# Patient Record
Sex: Male | Born: 1970 | Race: White | Hispanic: No | Marital: Married | Smoking: Never smoker
Health system: Southern US, Community
[De-identification: ages and names within clinical notes are randomized; demographics above are authoritative.]

## PROBLEM LIST (undated history)

## (undated) DIAGNOSIS — J45909 Unspecified asthma, uncomplicated: Secondary | ICD-10-CM

## (undated) DIAGNOSIS — J302 Other seasonal allergic rhinitis: Secondary | ICD-10-CM

## (undated) DIAGNOSIS — Z1211 Encounter for screening for malignant neoplasm of colon: Secondary | ICD-10-CM

## (undated) DIAGNOSIS — G4733 Obstructive sleep apnea (adult) (pediatric): Secondary | ICD-10-CM

## (undated) DIAGNOSIS — C4441 Basal cell carcinoma of skin of scalp and neck: Secondary | ICD-10-CM

## (undated) DIAGNOSIS — F419 Anxiety disorder, unspecified: Secondary | ICD-10-CM

## (undated) DIAGNOSIS — T7840XA Allergy, unspecified, initial encounter: Secondary | ICD-10-CM

## (undated) DIAGNOSIS — G4730 Sleep apnea, unspecified: Secondary | ICD-10-CM

## (undated) DIAGNOSIS — J189 Pneumonia, unspecified organism: Secondary | ICD-10-CM

## (undated) HISTORY — DX: Anxiety disorder, unspecified: F41.9

## (undated) HISTORY — DX: Obstructive sleep apnea (adult) (pediatric): G47.33

## (undated) HISTORY — DX: Basal cell carcinoma of skin of scalp and neck: C44.41

## (undated) HISTORY — DX: Encounter for screening for malignant neoplasm of colon: Z12.11

## (undated) HISTORY — DX: Sleep apnea, unspecified: G47.30

## (undated) HISTORY — DX: Other seasonal allergic rhinitis: J30.2

## (undated) HISTORY — DX: Allergy, unspecified, initial encounter: T78.40XA

---
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
## 2009-10-23 HISTORY — PX: THROAT SURGERY: SHX803

## 2014-05-07 ENCOUNTER — Encounter: Payer: Self-pay | Admitting: Internal Medicine

## 2014-05-07 ENCOUNTER — Ambulatory Visit (INDEPENDENT_AMBULATORY_CARE_PROVIDER_SITE_OTHER): Payer: BC Managed Care – PPO | Admitting: Internal Medicine

## 2014-05-07 VITALS — BP 118/74 | HR 82 | Temp 98.2°F | Ht 73.0 in | Wt 202.0 lb

## 2014-05-07 DIAGNOSIS — J302 Other seasonal allergic rhinitis: Secondary | ICD-10-CM | POA: Insufficient documentation

## 2014-05-07 DIAGNOSIS — K4090 Unilateral inguinal hernia, without obstruction or gangrene, not specified as recurrent: Secondary | ICD-10-CM

## 2014-05-07 DIAGNOSIS — G4733 Obstructive sleep apnea (adult) (pediatric): Secondary | ICD-10-CM | POA: Insufficient documentation

## 2014-05-07 HISTORY — DX: Unilateral inguinal hernia, without obstruction or gangrene, not specified as recurrent: K40.90

## 2014-05-07 NOTE — Progress Notes (Signed)
Pre visit review using our clinic review tool, if applicable. No additional management support is needed unless otherwise documented below in the visit note. 

## 2014-05-07 NOTE — Patient Instructions (Signed)
   Next visit is for a physical exam at your convenience, fasting Please make an appointment       Hernia A hernia occurs when an internal organ pushes out through a weak spot in the abdominal wall. Hernias most commonly occur in the groin and around the navel. Hernias often can be pushed back into place (reduced). Most hernias tend to get worse over time. Some abdominal hernias can get stuck in the opening (irreducible or incarcerated hernia) and cannot be reduced. An irreducible abdominal hernia which is tightly squeezed into the opening is at risk for impaired blood supply (strangulated hernia). A strangulated hernia is a medical emergency. Because of the risk for an irreducible or strangulated hernia, surgery may be recommended to repair a hernia. CAUSES   Heavy lifting.  Prolonged coughing.  Straining to have a bowel movement.  A cut (incision) made during an abdominal surgery. HOME CARE INSTRUCTIONS   Bed rest is not required. You may continue your normal activities.  Avoid lifting more than 10 pounds (4.5 kg) or straining.  Cough gently. If you are a smoker it is best to stop. Even the best hernia repair can break down with the continual strain of coughing. Even if you do not have your hernia repaired, a cough will continue to aggravate the problem.  Do not wear anything tight over your hernia. Do not try to keep it in with an outside bandage or truss. These can damage abdominal contents if they are trapped within the hernia sac.  Eat a normal diet.  Avoid constipation. Straining over long periods of time will increase hernia size and encourage breakdown of repairs. If you cannot do this with diet alone, stool softeners may be used. SEEK IMMEDIATE MEDICAL CARE IF:   You have a fever.  You develop increasing abdominal pain.  You feel nauseous or vomit.  Your hernia is stuck outside the abdomen, looks discolored, feels hard, or is tender.  You have any changes in your  bowel habits or in the hernia that are unusual for you.  You have increased pain or swelling around the hernia.  You cannot push the hernia back in place by applying gentle pressure while lying down. MAKE SURE YOU:   Understand these instructions.  Will watch your condition.  Will get help right away if you are not doing well or get worse. Document Released: 10/09/2005 Document Revised: 01/01/2012 Document Reviewed: 05/28/2008 [facility] Patient Information 2015 [place], [place]. This information is not intended to replace advice given to you by your health care provider. Make sure you discuss any questions you have with your health care provider.

## 2014-05-07 NOTE — Progress Notes (Signed)
   Subjective:    Patient ID: David Lowery, male    DOB: 04-26-1971, 43 y.o.   MRN: [id number]  DOS:  05/07/2014 Type of visit - description: new patient  History: Patient noted a bulge at the L suprapiubic area to 3 weeks ago, since then is not getting worse, denies any pain. Patient feels that this started acutely after he was going upstairs. Denies any scrotal pain or swelling   ROS No fever or chills No nausea, vomiting, diarrhea or blood in the stool  Past Medical History  Diagnosis Date  . Seasonal allergies   . OSA (obstructive sleep apnea)     initial sx: palpitations-fatigue--s/p surgery, much improved   . Anxiety     Past Surgical History  Procedure Laterality Date  . Throat surgery  2011    tonsilectomy, uvulectomy d/t OSA     History   Social History  . Marital Status: Married    Spouse Name: N/A    Number of Children: 1  . Years of Education: N/A   Occupational History  . Engineer, technical sales    Social History Main Topics  . Smoking status: Never Smoker   . Smokeless tobacco: Not on file  . Alcohol Use: Yes     Comment: socially   . Drug Use: Not on file  . Sexual Activity: Not on file   Other Topics Concern  . Not on file   Social History Narrative   Moved to [facility] July 2014, from [employer]        Medication List       This list is accurate as of: 05/07/14  5:25 PM.  Always use your most recent med list.               sertraline 100 MG tablet  Commonly known as:  ZOLOFT  Take 100 mg by mouth daily.           Objective:   Physical Exam  Abdominal:     BP 118/74  Pulse 82  Temp(Src) 98.2 F (36.8 C)  Ht 6\' 1"  (1.854 m)  Wt 202 lb (91.627 kg)  BMI 26.66 kg/m2  SpO2 97%  General -- alert, well-developed, NAD.   HEENT-- Not pale. Lungs -- normal respiratory effort, no intercostal retractions, no accessory muscle use, and normal breath sounds.  Heart-- normal rate, regular rhythm, no murmur.  Abdomen-- Not  distended, good bowel sounds,soft, non-tender.  GU-- normal scrotal contents and penis  Extremities-- no pretibial edema bilaterally  Neurologic--  alert & oriented X3. Speech normal, gait appropriate for age, strength symmetric and appropriate for age.  Psych-- Cognition and judgment appear intact. Cooperative with normal attention span and concentration. No anxious or depressed appearing.      Assessment & Plan:

## 2014-05-07 NOTE — Assessment & Plan Note (Signed)
Findings consistent with an inguinal hernia. Refer to surgery, avoid heavy lifting for now, signs of incarceration discussed, will need  ER visit if that ever  happens

## 2014-05-08 ENCOUNTER — Telehealth: Payer: Self-pay | Admitting: *Deleted

## 2014-05-08 NOTE — Telephone Encounter (Signed)
Pt brought in Medical Clearance form from [employer] sheet attached forms filled out as much as possible, and placed in folder for Dr. Larose Kells to complete and sign.//AB/CMA

## 2014-05-11 ENCOUNTER — Ambulatory Visit (INDEPENDENT_AMBULATORY_CARE_PROVIDER_SITE_OTHER): Payer: BC Managed Care – PPO | Admitting: Internal Medicine

## 2014-05-11 ENCOUNTER — Encounter: Payer: Self-pay | Admitting: Internal Medicine

## 2014-05-11 VITALS — BP 126/78 | HR 71 | Temp 98.2°F | Wt 198.0 lb

## 2014-05-11 DIAGNOSIS — K4090 Unilateral inguinal hernia, without obstruction or gangrene, not specified as recurrent: Secondary | ICD-10-CM

## 2014-05-11 NOTE — Progress Notes (Signed)
Pre visit review using our clinic review tool, if applicable. No additional management support is needed unless otherwise documented below in the visit note. 

## 2014-05-11 NOTE — Assessment & Plan Note (Addendum)
Patient was gived a work note for  light-duty ref hernia, he needs a note saying that he can go back to full duty. The patient thinks he is fully capable  of do that consequently I did a new note; has an appointment pending w/ surgery

## 2014-05-11 NOTE — Progress Notes (Signed)
   Subjective:    Patient ID: David Lowery, male    DOB: 03/26/1971, 43 y.o.   MRN: [id number]  DOS:  05/11/2014 Type of visit - description: Followup from previous visit History: Clinical situation has not changed but he needs a new note for work, see assessment and plan    Past Medical History  Diagnosis Date  . Seasonal allergies   . OSA (obstructive sleep apnea)     initial sx: palpitations-fatigue--s/p surgery, much improved   . Anxiety     Past Surgical History  Procedure Laterality Date  . Throat surgery  2011    tonsilectomy, uvulectomy d/t OSA     History   Social History  . Marital Status: Married    Spouse Name: N/A    Number of Children: 1  . Years of Education: N/A   Occupational History  . Engineer, technical sales    Social History Main Topics  . Smoking status: Never Smoker   . Smokeless tobacco: Not on file  . Alcohol Use: Yes     Comment: socially   . Drug Use: Not on file  . Sexual Activity: Not on file   Other Topics Concern  . Not on file   Social History Narrative   Moved to [facility] July 2014, from [employer]        Medication List       This list is accurate as of: 05/11/14  5:43 PM.  Always use your most recent med list.               sertraline 100 MG tablet  Commonly known as:  ZOLOFT  Take 100 mg by mouth daily.           Objective:   Physical Exam BP 126/78  Pulse 71  Temp(Src) 98.2 F (36.8 C)  Wt 198 lb (89.812 kg)  SpO2 98%  General -- alert, well-developed, NAD.   Psych-- Cognition and judgment appear intact. Cooperative with normal attention span and concentration. No anxious or depressed appearing.       Assessment & Plan:

## 2014-05-18 NOTE — Telephone Encounter (Signed)
Dr. Larose Kells reviewed and signed.  Pt came to clinic and picked up forms.  Forms labeled and placed in basket to be scanned.

## 2014-05-28 ENCOUNTER — Ambulatory Visit (INDEPENDENT_AMBULATORY_CARE_PROVIDER_SITE_OTHER): Payer: BC Managed Care – PPO | Admitting: General Surgery

## 2014-06-09 ENCOUNTER — Ambulatory Visit (INDEPENDENT_AMBULATORY_CARE_PROVIDER_SITE_OTHER): Payer: BC Managed Care – PPO | Admitting: General Surgery

## 2014-06-09 ENCOUNTER — Encounter (INDEPENDENT_AMBULATORY_CARE_PROVIDER_SITE_OTHER): Payer: Self-pay | Admitting: General Surgery

## 2014-06-09 VITALS — BP 118/76 | HR 71 | Temp 97.1°F | Resp 16 | Ht 72.0 in | Wt 196.4 lb

## 2014-06-09 DIAGNOSIS — K4090 Unilateral inguinal hernia, without obstruction or gangrene, not specified as recurrent: Secondary | ICD-10-CM

## 2014-06-09 NOTE — Progress Notes (Signed)
Patient ID: David Lowery, male   DOB: 1970-10-25, 43 y.o.   MRN: [id number]  No chief complaint on file.   HPI David Lowery is a 42 y.o. male.   The patient is a 43 year old male who is referred for evaluation of a left inguinal hernia. The patient states she is unaware of when this began. He does not have any pain however notices a bulge. The patient works as an Electrical engineer. He states it is not interfering with his daily functions and activities.   HPI  Past Medical History  Diagnosis Date  . Seasonal allergies   . OSA (obstructive sleep apnea)     initial sx: palpitations-fatigue--s/p surgery, much improved   . Anxiety     Past Surgical History  Procedure Laterality Date  . Throat surgery  2011    tonsilectomy, uvulectomy d/t OSA     Family History  Problem Relation Age of Onset  . Hyperlipidemia Brother     bro, mother, GM  . CAD Neg Hx   . Stroke Neg Hx   . Colon cancer Other     GM  . Prostate cancer Neg Hx   . Diabetes Other     aunt    Social History History  Substance Use Topics  . Smoking status: Never Smoker   . Smokeless tobacco: Not on file  . Alcohol Use: Yes     Comment: socially     No Known Allergies  Current Outpatient Prescriptions  Medication Sig Dispense Refill  . sertraline (ZOLOFT) 100 MG tablet Take 100 mg by mouth daily.       No current facility-administered medications for this visit.    Review of Systems Review of Systems  Constitutional: Negative.   HENT: Negative.   Eyes: Negative.   Respiratory: Negative.   Cardiovascular: Negative.   Gastrointestinal: Negative.   Endocrine: Negative.   Neurological: Negative.     Blood pressure 118/76, pulse 71, temperature 97.1 F (36.2 C), temperature source Temporal, resp. rate 16, height 6' (1.829 m), weight 196 lb 6.4 oz (89.086 kg).  Physical Exam Physical Exam  Constitutional: He is oriented to person, place, and time. He appears well-developed and well-nourished.   HENT:  Head: Normocephalic and atraumatic.  Eyes: Conjunctivae and EOM are normal. Pupils are equal, round, and reactive to light.  Neck: Normal range of motion. Neck supple.  Cardiovascular: Normal rate, regular rhythm and normal heart sounds.   Pulmonary/Chest: Effort normal and breath sounds normal.  Abdominal: A hernia is present. Hernia confirmed positive in the left inguinal area. Hernia confirmed negative in the right inguinal area.  Musculoskeletal: Normal range of motion.  Neurological: He is alert and oriented to person, place, and time.  Skin: Skin is warm and dry.    Data Reviewed none  Assessment    43 year old male with a left likely direct inguinal hernia     Plan    1. Patient likely be observed at least for the next one to 2 months. We did discuss laparoscopic hernia repair. He would be an option in this most likely in the next few months. 2. Discussed with him the signs or symptoms of incarceration and strangulation, and the need to proceed to the ER should these occur. 3. The patient recalls that he decide about surgery.        Rosario Jacks., David Lowery 06/09/2014, 9:09 AM

## 2014-08-12 ENCOUNTER — Ambulatory Visit (INDEPENDENT_AMBULATORY_CARE_PROVIDER_SITE_OTHER): Payer: BC Managed Care – PPO | Admitting: Internal Medicine

## 2014-08-12 ENCOUNTER — Encounter: Payer: Self-pay | Admitting: Internal Medicine

## 2014-08-12 VITALS — BP 142/78 | HR 101 | Temp 97.5°F | Ht 73.0 in | Wt 196.1 lb

## 2014-08-12 DIAGNOSIS — Z0000 Encounter for general adult medical examination without abnormal findings: Secondary | ICD-10-CM

## 2014-08-12 DIAGNOSIS — K4090 Unilateral inguinal hernia, without obstruction or gangrene, not specified as recurrent: Secondary | ICD-10-CM

## 2014-08-12 DIAGNOSIS — L989 Disorder of the skin and subcutaneous tissue, unspecified: Secondary | ICD-10-CM

## 2014-08-12 DIAGNOSIS — R7989 Other specified abnormal findings of blood chemistry: Secondary | ICD-10-CM

## 2014-08-12 HISTORY — DX: Encounter for general adult medical examination without abnormal findings: Z00.00

## 2014-08-12 LAB — CBC WITH DIFFERENTIAL/PLATELET
BASOS PCT: 0.5 % (ref 0.0–3.0)
Basophils Absolute: 0 10*3/uL (ref 0.0–0.1)
EOS PCT: 3.6 % (ref 0.0–5.0)
Eosinophils Absolute: 0.2 10*3/uL (ref 0.0–0.7)
HCT: 44.6 % (ref 39.0–52.0)
HEMOGLOBIN: 15.1 g/dL (ref 13.0–17.0)
LYMPHS PCT: 30.9 % (ref 12.0–46.0)
Lymphs Abs: 1.9 10*3/uL (ref 0.7–4.0)
MCHC: 33.9 g/dL (ref 30.0–36.0)
MCV: 94 fl (ref 78.0–100.0)
Monocytes Absolute: 0.3 10*3/uL (ref 0.1–1.0)
Monocytes Relative: 5.2 % (ref 3.0–12.0)
Neutro Abs: 3.7 10*3/uL (ref 1.4–7.7)
Neutrophils Relative %: 59.8 % (ref 43.0–77.0)
Platelets: 231 10*3/uL (ref 150.0–400.0)
RBC: 4.74 Mil/uL (ref 4.22–5.81)
RDW: 12.5 % (ref 11.5–15.5)
WBC: 6.2 10*3/uL (ref 4.0–10.5)

## 2014-08-12 LAB — TSH: TSH: 0.27 u[IU]/mL — ABNORMAL LOW (ref 0.35–4.50)

## 2014-08-12 NOTE — Progress Notes (Signed)
Subjective:    Patient ID: David Lowery, male    DOB: [birth year], 43 y.o.   MRN: [id number]  DOS:  08/12/2014 Type of visit - description : CPX Interval history: In general, feels well 2 year history of a cyst in the back, likes it to be checked. Also has a spot at the nose, it bleeds on and off, never heals completely   ROS Diet-- regular but trying to avoid fatty-fast food  Exercise-- very active at work  No  CP, SOB No palpitations Denies  nausea, vomiting diarrhea, blood in the stools  (-) cough, sputum production (-) wheezing, chest congestion No dysuria, gross hematuria, difficulty urinating    No anxiety, depression (on meds , well controlled)      Past Medical History  Diagnosis Date  . Seasonal allergies   . OSA (obstructive sleep apnea)     initial sx: palpitations-fatigue--s/p surgery, much improved   . Anxiety     Past Surgical History  Procedure Laterality Date  . Throat surgery  2011    tonsilectomy, uvulectomy d/t OSA     History   Social History  . Marital Status: Married    Spouse Name: N/A    Number of Children: 1  . Years of Education: N/A   Occupational History  . Engineer, technical sales    Social History Main Topics  . Smoking status: Never Smoker   . Smokeless tobacco: Not on file  . Alcohol Use: Yes     Comment: socially   . Drug Use: Not on file  . Sexual Activity: Not on file   Other Topics Concern  . Not on file   Social History Narrative   Moved to [facility] July 2014, from [employer]     Family History  Problem Relation Age of Onset  . Hyperlipidemia Brother     bro, mother, GM  . CAD Neg Hx   . Stroke Neg Hx   . Colon cancer Other     GM at age 7  . Prostate cancer Neg Hx   . Diabetes Other     aunt       Medication List       This list is accurate as of: 08/12/14 10:27 AM.  Always use your most recent med list.               sertraline 100 MG tablet  Commonly known as:  ZOLOFT  Take 100 mg  by mouth daily.           Objective:   Physical Exam BP 142/78  Pulse 101  Temp(Src) 97.5 F (36.4 C) (Oral)  Ht 6\' 1"  (1.854 m)  Wt 196 lb 2 oz (88.962 kg)  BMI 25.88 kg/m2  SpO2 97%  General -- alert, well-developed, NAD.  Neck --no thyromegaly , normal carotid pulse  HEENT-- L side of the nose has a 2-3 mm area of redness-scallines  At the right trapezoid area has a 1.5 cm subcutaneous mass consistent with a cyst, not attached to deeper structures Lungs -- normal respiratory effort, no intercostal retractions, no accessory muscle use, and normal breath sounds.  Heart-- normal rate, regular rhythm, no murmur.  Abdomen-- Not distended, good bowel sounds,soft, non-tender.  Extremities-- no pretibial edema bilaterally  Neurologic--  alert & oriented X3. Speech normal, gait appropriate for age, strength symmetric and appropriate for age.  Psych-- Cognition and judgment appear intact. Cooperative with normal attention span and concentration. No anxious or  depressed appearing.     Assessment & Plan:

## 2014-08-12 NOTE — Assessment & Plan Note (Addendum)
Td 2007 Declined flu shot  cscope-- never Labs ekg-- nsr, no acute Other: Skin lesion, BCC? -- to derm Sebaceous cyst-- observation Diet-exercise-- discussed  STE

## 2014-08-12 NOTE — Patient Instructions (Signed)
Get your blood work before you leave    Check the  blood pressure monthly  Be sure your blood pressure is between  145/85 and 110/65.  if it is consistently higher or lower, let me know   If you need more information about a healthy diet,   visit  the American Heart Association, it  is a great resource online at:  http://www.richard-flynn.net/    Please come back to the office in 1 year for a physical exam. Come back fasting     Testicular Self-Exam A self-examination of your testicles involves looking at and feeling your testicles for abnormal lumps or swelling. Several things can cause swelling, lumps, or pain in your testicles. Some of these causes are:  Injuries.  Inflammation.  Infection.  Accumulation of fluids around your testicle (hydrocele).  Twisted testicles (testicular torsion).  Testicular cancer. Self-examination of the testicles and groin areas may be advised if you are at risk for testicular cancer. Risks for testicular cancer include:  An undescended testicle (cryptorchidism).  A history of previous testicular cancer.  A family history of testicular cancer. The testicles are easiest to examine after warm baths or showers and are more difficult to examine when you are cold. This is because the muscles attached to the testicles retract and pull them up higher or into the abdomen. Follow these steps while you are standing:  Hold your penis away from your body.  Roll one testicle between your thumb and forefinger, feeling the entire testicle.  Roll the other testicle between your thumb and forefinger, feeling the entire testicle. Feel for lumps, swelling, or discomfort. A normal testicle is egg shaped and feels firm. It is smooth and not tender. The spermatic cord can be felt as a firm spaghetti-like cord at the back of your testicle. It is also important to examine the crease between the front of your leg and your abdomen. Feel for any bumps that are  tender. These could be enlarged lymph nodes.  Document Released: 01/15/2001 Document Revised: 06/11/2013 Document Reviewed: 03/31/2013 [facility] Patient Information 2015 [place], [place]. This information is not intended to replace advice given to you by your health care provider. Make sure you discuss any questions you have with your health care provider.

## 2014-08-12 NOTE — Progress Notes (Signed)
Pre visit review using our clinic review tool, if applicable. No additional management support is needed unless otherwise documented below in the visit note. 

## 2014-08-12 NOTE — Assessment & Plan Note (Signed)
Plans for surgery 10-2014

## 2014-08-13 LAB — LIPID PANEL
CHOLESTEROL: 185 mg/dL (ref 0–200)
HDL: 39.3 mg/dL (ref >39.00)
LDL CALC: 122 mg/dL — AB (ref 0–99)
NonHDL: 145.7
TRIGLYCERIDES: 121 mg/dL (ref 0.0–149.0)
Total CHOL/HDL Ratio: 5
VLDL: 24.2 mg/dL (ref 0.0–40.0)

## 2014-08-13 LAB — COMPREHENSIVE METABOLIC PANEL
ALBUMIN: 4 g/dL (ref 3.5–5.2)
ALK PHOS: 66 U/L (ref 39–117)
ALT: 31 U/L (ref 0–53)
AST: 33 U/L (ref 0–37)
BUN: 14 mg/dL (ref 6–23)
CALCIUM: 9.3 mg/dL (ref 8.4–10.5)
CHLORIDE: 103 meq/L (ref 96–112)
CO2: 30 mEq/L (ref 19–32)
CREATININE: 0.8 mg/dL (ref 0.4–1.5)
GFR: 108.3 mL/min (ref >60.00)
GLUCOSE: 88 mg/dL (ref 70–99)
POTASSIUM: 4 meq/L (ref 3.5–5.1)
Sodium: 138 mEq/L (ref 135–145)
Total Bilirubin: 1.3 mg/dL — ABNORMAL HIGH (ref 0.2–1.2)
Total Protein: 7.5 g/dL (ref 6.0–8.3)

## 2014-08-17 NOTE — Addendum Note (Signed)
Addended by: Wilfrid Lund on: 08/17/2014 11:17 AM   Modules accepted: Orders

## 2014-09-22 ENCOUNTER — Other Ambulatory Visit: Payer: Self-pay | Admitting: Internal Medicine

## 2014-09-28 ENCOUNTER — Other Ambulatory Visit: Payer: Self-pay | Admitting: Internal Medicine

## 2014-11-16 ENCOUNTER — Other Ambulatory Visit (INDEPENDENT_AMBULATORY_CARE_PROVIDER_SITE_OTHER): Payer: BLUE CROSS/BLUE SHIELD

## 2014-11-16 DIAGNOSIS — R7989 Other specified abnormal findings of blood chemistry: Secondary | ICD-10-CM

## 2014-11-16 LAB — TSH: TSH: 0.95 u[IU]/mL (ref 0.35–4.50)

## 2014-11-16 LAB — T4, FREE: FREE T4: 0.77 ng/dL (ref 0.60–1.60)

## 2014-11-16 LAB — T3, FREE: T3 FREE: 3.8 pg/mL (ref 2.3–4.2)

## 2015-10-29 ENCOUNTER — Telehealth: Payer: Self-pay | Admitting: Internal Medicine

## 2015-10-29 MED ORDER — SERTRALINE HCL 100 MG PO TABS: 100.0000 mg | ORAL_TABLET | Freq: Every day | ORAL | Status: DC

## 2015-10-29 NOTE — Telephone Encounter (Signed)
Pharmacy: [facility] [address]  Reason for call: pt called for refill on sertraline 100mg . Has 5 left. Scheduled cpe for 11/30/14. Pt would like 30 day supply sent in until he comes for appt.

## 2015-10-29 NOTE — Telephone Encounter (Signed)
Please advise, Pt has not been seen since 07/2014.

## 2015-10-29 NOTE — Telephone Encounter (Signed)
Ok  #30 

## 2015-10-29 NOTE — Telephone Encounter (Signed)
Rx sent 

## 2015-11-29 ENCOUNTER — Other Ambulatory Visit: Payer: Self-pay | Admitting: Internal Medicine

## 2015-11-30 ENCOUNTER — Telehealth: Payer: Self-pay | Admitting: *Deleted

## 2015-11-30 NOTE — Telephone Encounter (Signed)
Unable to reach patient at time of pre-visit call. Left message for patient to return call when available.  

## 2015-12-01 ENCOUNTER — Encounter: Payer: Self-pay | Admitting: Internal Medicine

## 2015-12-01 ENCOUNTER — Ambulatory Visit (INDEPENDENT_AMBULATORY_CARE_PROVIDER_SITE_OTHER): Payer: BLUE CROSS/BLUE SHIELD | Admitting: Internal Medicine

## 2015-12-01 VITALS — BP 112/58 | HR 65 | Temp 98.1°F | Ht 73.0 in | Wt 205.2 lb

## 2015-12-01 DIAGNOSIS — Z0000 Encounter for general adult medical examination without abnormal findings: Secondary | ICD-10-CM | POA: Diagnosis not present

## 2015-12-01 DIAGNOSIS — Z23 Encounter for immunization: Secondary | ICD-10-CM

## 2015-12-01 DIAGNOSIS — Z114 Encounter for screening for human immunodeficiency virus [HIV]: Secondary | ICD-10-CM

## 2015-12-01 MED ORDER — SERTRALINE HCL 100 MG PO TABS: 100.0000 mg | ORAL_TABLET | Freq: Every day | ORAL | Status: DC

## 2015-12-01 NOTE — Assessment & Plan Note (Signed)
Td 2007, today Declined flu shot  cscope-- never Labs: CMP, CBC, TSH, FLP, HIV. Discussed s diet and exercise

## 2015-12-01 NOTE — Patient Instructions (Signed)
  GO TO THE FRONT DESK  Schedule labs to be done within few days (fasting)   Schedule a complete physical exam to be done in 1 year Please be fasting

## 2015-12-01 NOTE — Progress Notes (Signed)
Pre visit review using our clinic review tool, if applicable. No additional management support is needed unless otherwise documented below in the visit note. 

## 2015-12-01 NOTE — Progress Notes (Signed)
Subjective:    Patient ID: David Lowery, male    DOB: 1971/07/10, 45 y.o.   MRN: [id number]  DOS:  12/01/2015 Type of visit - description : cpx Interval history: In general feeling great, active at work, does watch his diet.    Review of Systems Constitutional: No fever. No chills. No unexplained wt changes. No unusual sweats  HEENT: No dental problems, no ear discharge, no facial swelling, no voice changes. No eye discharge, no eye  redness , no  intolerance to light   Respiratory: No wheezing , no  difficulty breathing. No cough , no mucus production  Cardiovascular: No CP, no leg swelling , no  Palpitations  GI: no nausea, no vomiting, no diarrhea , no  abdominal pain.  No blood in the stools. No dysphagia, no odynophagia    Endocrine: No polyphagia, no polyuria , no polydipsia  GU: No dysuria, gross hematuria, difficulty urinating. No urinary urgency, no frequency.  Musculoskeletal: No joint swellings or unusual aches or pains  Skin: No change in the color of the skin, palor , no  Rash  Allergic, immunologic: No environmental allergies , no  food allergies  Neurological: No dizziness no  syncope. No headaches. No diplopia, no slurred, no slurred speech, no motor deficits, no facial  Numbness  Hematological: No enlarged lymph nodes, no easy bruising , no unusual bleedings  Psychiatry: No suicidal ideas, no hallucinations, no beavior problems, no confusion.  No unusual/severe anxiety, no depression    Past Medical History  Diagnosis Date  . Seasonal allergies   . OSA (obstructive sleep apnea)     initial sx: palpitations-fatigue--s/p surgery, much improved   . Anxiety     Past Surgical History  Procedure Laterality Date  . Throat surgery  2011    tonsilectomy, uvulectomy d/t OSA     Social History   Social History  . Marital Status: Married    Spouse Name: N/A  . Number of Children: 1  . Years of Education: N/A   Occupational History  . Control and instrumentation engineer     [employer]   Social History Main Topics  . Smoking status: Never Smoker   . Smokeless tobacco: Never Used  . Alcohol Use: Yes     Comment: socially   . Drug Use: Not on file  . Sexual Activity: Not on file   Other Topics Concern  . Not on file   Social History Narrative   Moved to [facility] July 2014, from [place] [employer]   Son born ~ 2012     Family History  Problem Relation Age of Onset  . Hyperlipidemia Brother     bro, mother, GM  . CAD Neg Hx   . Stroke Neg Hx   . Colon cancer Other     GM at age 24  . Prostate cancer Neg Hx   . Diabetes Other     aunt       Medication List       This list is accurate as of: 12/01/15  5:19 PM.  Always use your most recent med list.               sertraline 100 MG tablet  Commonly known as:  ZOLOFT  Take 1 tablet (100 mg total) by mouth daily.           Objective:   Physical Exam BP 112/58 mmHg  Pulse 65  Temp(Src) 98.1 F (36.7 C) (Oral)  Ht 6'  1" (1.854 m)  Wt 205 lb 4 oz (93.101 kg)  BMI 27.09 kg/m2  SpO2 96% General:   Well developed, well nourished . NAD.  HEENT:  Normocephalic . Face symmetric, atraumatic. Neck: No thyromegaly Lungs:  CTA B Normal respiratory effort, no intercostal retractions, no accessory muscle use. Heart: RRR,  no murmur.  no pretibial edema bilaterally  Abdomen:  Not distended, soft, non-tender. No rebound or rigidity.   Skin: Not pale. Not jaundice Neurologic:  alert & oriented X3.  Speech normal, gait appropriate for age and unassisted Psych--  Cognition and judgment appear intact.  Cooperative with normal attention span and concentration.  Behavior appropriate. No anxious or depressed appearing.     Assessment & Plan:   Assessment OSA -initial sx: palpitations-fatigue--s/p surgery, much improved  Anxiety Allergies Hernia L ----- saw surgery 2015, offered surgery Actinic keratosis, nevus -- saw derm, excisions 2015   Plan Anxiety well controlled,  refill medications. Hernia: Decided not to proceed with surgery, asymptomatic. RTC 1 year

## 2015-12-03 ENCOUNTER — Other Ambulatory Visit (INDEPENDENT_AMBULATORY_CARE_PROVIDER_SITE_OTHER): Payer: BLUE CROSS/BLUE SHIELD

## 2015-12-03 DIAGNOSIS — Z0000 Encounter for general adult medical examination without abnormal findings: Secondary | ICD-10-CM | POA: Diagnosis not present

## 2015-12-03 DIAGNOSIS — Z114 Encounter for screening for human immunodeficiency virus [HIV]: Secondary | ICD-10-CM

## 2015-12-03 LAB — COMPREHENSIVE METABOLIC PANEL
ALT: 30 U/L (ref 0–53)
AST: 22 U/L (ref 0–37)
Albumin: 4.4 g/dL (ref 3.5–5.2)
Alkaline Phosphatase: 62 U/L (ref 39–117)
BILIRUBIN TOTAL: 0.8 mg/dL (ref 0.2–1.2)
BUN: 14 mg/dL (ref 6–23)
CHLORIDE: 104 meq/L (ref 96–112)
CO2: 29 meq/L (ref 19–32)
CREATININE: 0.76 mg/dL (ref 0.40–1.50)
Calcium: 9.3 mg/dL (ref 8.4–10.5)
GFR: 119.14 mL/min (ref >60.00)
Glucose, Bld: 103 mg/dL — ABNORMAL HIGH (ref 70–99)
Potassium: 4 mEq/L (ref 3.5–5.1)
SODIUM: 139 meq/L (ref 135–145)
Total Protein: 7 g/dL (ref 6.0–8.3)

## 2015-12-03 LAB — TSH: TSH: 1.01 u[IU]/mL (ref 0.35–4.50)

## 2015-12-03 LAB — LIPID PANEL
CHOL/HDL RATIO: 6
Cholesterol: 192 mg/dL (ref 0–200)
HDL: 33.9 mg/dL — ABNORMAL LOW (ref >39.00)
LDL CALC: 129 mg/dL — AB (ref 0–99)
NONHDL: 158.4
Triglycerides: 147 mg/dL (ref 0.0–149.0)
VLDL: 29.4 mg/dL (ref 0.0–40.0)

## 2015-12-04 LAB — HIV ANTIBODY (ROUTINE TESTING W REFLEX): HIV 1&2 Ab, 4th Generation: NONREACTIVE

## 2015-12-05 LAB — CBC WITH DIFFERENTIAL/PLATELET
BASOS ABS: 0 10*3/uL (ref 0.0–0.1)
Basophils Relative: 0.6 % (ref 0.0–3.0)
EOS ABS: 0.2 10*3/uL (ref 0.0–0.7)
Eosinophils Relative: 3.1 % (ref 0.0–5.0)
HEMATOCRIT: 45.9 % (ref 39.0–52.0)
HEMOGLOBIN: 14.8 g/dL (ref 13.0–17.0)
LYMPHS PCT: 26 % (ref 12.0–46.0)
Lymphs Abs: 1.8 10*3/uL (ref 0.7–4.0)
MCHC: 32.2 g/dL (ref 30.0–36.0)
MCV: 100.4 fl — ABNORMAL HIGH (ref 78.0–100.0)
Monocytes Absolute: 0.4 10*3/uL (ref 0.1–1.0)
Monocytes Relative: 6.5 % (ref 3.0–12.0)
Neutro Abs: 4.3 10*3/uL (ref 1.4–7.7)
Neutrophils Relative %: 63.8 % (ref 43.0–77.0)
PLATELETS: 267 10*3/uL (ref 150.0–400.0)
RBC: 4.57 Mil/uL (ref 4.22–5.81)
RDW: 13.1 % (ref 11.5–15.5)
WBC: 6.7 10*3/uL (ref 4.0–10.5)

## 2016-03-27 ENCOUNTER — Encounter: Payer: Self-pay | Admitting: Internal Medicine

## 2016-03-27 ENCOUNTER — Ambulatory Visit (INDEPENDENT_AMBULATORY_CARE_PROVIDER_SITE_OTHER): Payer: BLUE CROSS/BLUE SHIELD | Admitting: Internal Medicine

## 2016-03-27 VITALS — BP 132/72 | HR 72 | Temp 98.1°F | Ht 73.0 in | Wt 211.4 lb

## 2016-03-27 DIAGNOSIS — J189 Pneumonia, unspecified organism: Secondary | ICD-10-CM

## 2016-03-27 NOTE — Progress Notes (Signed)
Subjective:    Patient ID: David Lowery, male    DOB: 1971/10/07, 45 y.o.   MRN: [id number]  DOS:  03/27/2016 Type of visit - description : Pneumonia follow-up Interval history: Patient reports in April had the flu, high fever, a number of symptoms, since then he was left with a lingering cough that got worse 2 weeks ago.  was seen 03/19/2016 at a UC d/t cough  apparently no chest x-ray was done but he was diagnosed with pneumonia and prescribed Augmentin, albuterol, hydrocodone based on clinical findings. He took Augmentin albuterol and Mucinex and is feeling much better.   Review of Systems  At this point denies fever chills Cough has diminished No wheezing No nausea, vomiting, diarrhea Past Medical History  Diagnosis Date  . Seasonal allergies   . OSA (obstructive sleep apnea)     initial sx: palpitations-fatigue--s/p surgery, much improved   . Anxiety     Past Surgical History  Procedure Laterality Date  . Throat surgery  2011    tonsilectomy, uvulectomy d/t OSA     Social History   Social History  . Marital Status: Married    Spouse Name: N/A  . Number of Children: 1  . Years of Education: N/A   Occupational History  . Engineer, technical sales     [employer]   Social History Main Topics  . Smoking status: Never Smoker   . Smokeless tobacco: Never Used  . Alcohol Use: Yes     Comment: socially   . Drug Use: Not on file  . Sexual Activity: Not on file   Other Topics Concern  . Not on file   Social History Narrative   Moved to [facility] July 2014, from [place] [employer]   Son born ~ 2012        Medication List       This list is accurate as of: 03/27/16 11:59 PM.  Always use your most recent med list.               amoxicillin-clavulanate 875-125 MG tablet  Commonly known as:  AUGMENTIN  Take 1 tablet by mouth 2 (two) times daily.     PROAIR HFA 108 (90 Base) MCG/ACT inhaler  Generic drug:  albuterol  Inhale 1-2 puffs into the lungs every 6 (six)  hours as needed.     sertraline 100 MG tablet  Commonly known as:  ZOLOFT  Take 1 tablet (100 mg total) by mouth daily.     XYZAL ALLERGY 24HR PO  Take 1 tablet by mouth daily.           Objective:   Physical Exam BP 132/72 mmHg  Pulse 72  Temp(Src) 98.1 F (36.7 C) (Oral)  Ht 6\' 1"  (1.854 m)  Wt 211 lb 6 oz (95.879 kg)  BMI 27.89 kg/m2  SpO2 96% General:   Well developed, well nourished . NAD.  HEENT:  Normocephalic . Face symmetric, atraumatic. TMs normal, throat without redness, nose not congested Lungs:  CTA B Normal respiratory effort, no intercostal retractions, no accessory muscle use. Heart: RRR,  no murmur.  No pretibial edema bilaterally  Skin: Not pale. Not jaundice Neurologic:  alert & oriented X3.  Speech normal, gait appropriate for age and unassisted Psych--  Cognition and judgment appear intact.  Cooperative with normal attention span and concentration.  Behavior appropriate. No anxious or depressed appearing.      Assessment & Plan:   Assessment OSA -initial sx: palpitations-fatigue--s/p surgery,  much improved  Anxiety Allergies Hernia L ----- saw surgery 2015, offered surgery Actinic keratosis, nevus -- saw derm, excisions 2015   Plan Pneumonia: Diagnosed on clinical grounds,responded very well to Augmentin, exam today is essentially normal. Plan: Finish Augmentin, continue with albuterol and Mucinex when necessary, call if not completely well or if symptoms resurface; patient in agreement RTC 11-2016 as a schedule for CPX

## 2016-03-27 NOTE — Progress Notes (Signed)
Pre visit review using our clinic review tool, if applicable. No additional management support is needed unless otherwise documented below in the visit note. 

## 2016-03-28 DIAGNOSIS — Z09 Encounter for follow-up examination after completed treatment for conditions other than malignant neoplasm: Secondary | ICD-10-CM

## 2016-03-28 HISTORY — DX: Encounter for follow-up examination after completed treatment for conditions other than malignant neoplasm: Z09

## 2016-03-28 NOTE — Assessment & Plan Note (Signed)
Pneumonia: Diagnosed on clinical grounds,responded very well to Augmentin, exam today is essentially normal. Plan: Finish Augmentin, continue with albuterol and Mucinex when necessary, call if not completely well or if symptoms resurface; patient in agreement RTC 11-2016 as a schedule for CPX

## 2016-06-08 ENCOUNTER — Other Ambulatory Visit: Payer: Self-pay

## 2016-06-08 MED ORDER — SERTRALINE HCL 100 MG PO TABS: 100.0000 mg | ORAL_TABLET | Freq: Every day | ORAL | 2 refills | Status: DC

## 2016-12-02 ENCOUNTER — Emergency Department (HOSPITAL_BASED_OUTPATIENT_CLINIC_OR_DEPARTMENT_OTHER)
Admission: EM | Admit: 2016-12-02 | Discharge: 2016-12-03 | Disposition: A | Discharge status: Home / Self Care | Payer: BLUE CROSS/BLUE SHIELD | Attending: Emergency Medicine | Admitting: Emergency Medicine

## 2016-12-02 ENCOUNTER — Encounter (HOSPITAL_BASED_OUTPATIENT_CLINIC_OR_DEPARTMENT_OTHER): Payer: Self-pay | Admitting: *Deleted

## 2016-12-02 DIAGNOSIS — R1031 Right lower quadrant pain: Secondary | ICD-10-CM | POA: Diagnosis not present

## 2016-12-02 DIAGNOSIS — J45909 Unspecified asthma, uncomplicated: Secondary | ICD-10-CM | POA: Diagnosis not present

## 2016-12-02 DIAGNOSIS — R109 Unspecified abdominal pain: Secondary | ICD-10-CM | POA: Diagnosis not present

## 2016-12-02 LAB — URINALYSIS, ROUTINE W REFLEX MICROSCOPIC
BILIRUBIN URINE: NEGATIVE
GLUCOSE, UA: NEGATIVE mg/dL
HGB URINE DIPSTICK: NEGATIVE
KETONES UR: NEGATIVE mg/dL
LEUKOCYTES UA: NEGATIVE
Nitrite: NEGATIVE
PH: 6.5 (ref 5.0–8.0)
PROTEIN: NEGATIVE mg/dL
Specific Gravity, Urine: 1.022 (ref 1.005–1.030)

## 2016-12-02 NOTE — ED Provider Notes (Signed)
[place] [facility] Provider Note: David Spurling, MD, FACEP  CSN: [id number] MRN: [id number] ARRIVAL: 12/02/16 at 2106 ROOM: MH04/MH04  By signing my name below, I, David Lowery, attest that this documentation has been prepared under the direction and in the presence of David Rosser, MD. Electronically Signed: Soijett Lowery, ED Scribe. 12/02/16. 12:12 AM.  CHIEF COMPLAINT  Abdominal Pain   HISTORY OF PRESENT ILLNESS  David Lowery is a 46 y.o. male who presents to the Emergency Department complaining of episodic right sided abdominal pain onset 3 weeks ago worsening last night. Pt notes that his pain began radiating to his right lower quadrant 2 days ago. The patient is here after he experienced sudden onset RLQ abdominal pain yesterday evening. His pain episodes have typically been lasting about 10 minutes but the one that brought him to the ED lasted about 40 minutes. He is not aware of any triggering factors such as eating. Pt reports that he had similar symptoms 7 years ago in [place] with a negative workup. He hasn't tried any medications for the relief of his symptoms. He denies nausea, vomiting, diarrhea, constipation, fever, chills and urinary symptoms.     Past Medical History:  Diagnosis Date  . Anxiety   . OSA (obstructive sleep apnea)    initial sx: palpitations-fatigue--s/p surgery, much improved   . Seasonal allergies     Past Surgical History:  Procedure Laterality Date  . THROAT SURGERY  2011   tonsilectomy, uvulectomy d/t OSA     Family History  Problem Relation Age of Onset  . Hyperlipidemia Brother     bro, mother, GM  . CAD Neg Hx   . Stroke Neg Hx   . Colon cancer Other     GM at age 89  . Prostate cancer Neg Hx   . Diabetes Other     aunt    Social History  Substance Use Topics  . Smoking status: Never Smoker  . Smokeless tobacco: Never Used  . Alcohol use Yes     Comment: socially     Prior to Admission medications   Medication Sig  Start Date End Date Taking? Authorizing Provider  sertraline (ZOLOFT) 100 MG tablet Take 1 tablet (100 mg total) by mouth daily. 06/08/16   Colon Branch, MD    Allergies Sudafed [pseudoephedrine]   REVIEW OF SYSTEMS  Negative except as noted here or in the History of Present Illness.   PHYSICAL EXAMINATION  Initial Vital Signs Blood pressure 146/85, pulse 79, temperature 98.1 F (36.7 C), temperature source Oral, resp. rate 20, height 6' (1.829 m), weight 205 lb (93 kg), SpO2 93 %.  Examination General: Well-developed, well-nourished male in no acute distress; appearance consistent with age of record HENT: normocephalic; atraumatic Eyes: pupils equal, round and reactive to light; extraocular muscles intact Neck: supple Heart: regular rate and rhythm; no murmurs, rubs or gallops Lungs: clear to auscultation bilaterally Abdomen: soft; nondistended; mild LLQ tenderness which does not reproduce the pain of the chief complaint; no masses or hepatosplenomegaly; bowel sounds present; apparent cholelithiasis on bedside ultrasound:   Extremities: No deformity; full range of motion; pulses normal Neurologic: Awake, alert and oriented; motor function intact in all extremities and symmetric; no facial droop Skin: Warm and dry Psychiatric: Normal mood and affect   RESULTS  Summary of this visit's results, reviewed by myself:   EKG Interpretation  Date/Time:    Ventricular Rate:    PR Interval:    QRS Duration:  QT Interval:    QTC Calculation:   R Axis:     Text Interpretation:        Laboratory Studies: Results for orders placed or performed during the hospital encounter of 12/02/16 (from the past 24 hour(s))  Urinalysis, Routine w reflex microscopic     Status: None   Collection Time: 12/02/16  9:47 PM  Result Value Ref Range   Color, Urine YELLOW YELLOW   APPearance CLEAR CLEAR   Specific Gravity, Urine 1.022 1.005 - 1.030   pH 6.5 5.0 - 8.0   Glucose, UA NEGATIVE  NEGATIVE mg/dL   Hgb urine dipstick NEGATIVE NEGATIVE   Bilirubin Urine NEGATIVE NEGATIVE   Ketones, ur NEGATIVE NEGATIVE mg/dL   Protein, ur NEGATIVE NEGATIVE mg/dL   Nitrite NEGATIVE NEGATIVE   Leukocytes, UA NEGATIVE NEGATIVE  CBC with Differential/Platelet     Status: Abnormal   Collection Time: 12/03/16 12:00 AM  Result Value Ref Range   WBC 10.7 (H) 4.0 - 10.5 K/uL   RBC 4.44 4.22 - 5.81 MIL/uL   Hemoglobin 14.4 13.0 - 17.0 g/dL   HCT 40.1 39.0 - 52.0 %   MCV 90.3 78.0 - 100.0 fL   MCH 32.4 26.0 - 34.0 pg   MCHC 35.9 30.0 - 36.0 g/dL   RDW 11.7 11.5 - 15.5 %   Platelets 232 150 - 400 K/uL   Neutrophils Relative % 78 %   Neutro Abs 8.5 (H) 1.7 - 7.7 K/uL   Lymphocytes Relative 14 %   Lymphs Abs 1.5 0.7 - 4.0 K/uL   Monocytes Relative 7 %   Monocytes Absolute 0.7 0.1 - 1.0 K/uL   Eosinophils Relative 1 %   Eosinophils Absolute 0.1 0.0 - 0.7 K/uL   Basophils Relative 0 %   Basophils Absolute 0.0 0.0 - 0.1 K/uL  Comprehensive metabolic panel     Status: Abnormal   Collection Time: 12/03/16 12:00 AM  Result Value Ref Range   Sodium 138 135 - 145 mmol/L   Potassium 3.6 3.5 - 5.1 mmol/L   Chloride 103 101 - 111 mmol/L   CO2 29 22 - 32 mmol/L   Glucose, Bld 131 (H) 65 - 99 mg/dL   BUN 12 6 - 20 mg/dL   Creatinine, Ser 0.72 0.61 - 1.24 mg/dL   Calcium 9.2 8.9 - 10.3 mg/dL   Total Protein 7.0 6.5 - 8.1 g/dL   Albumin 4.3 3.5 - 5.0 g/dL   AST 238 (H) 15 - 41 U/L   ALT 157 (H) 17 - 63 U/L   Alkaline Phosphatase 80 38 - 126 U/L   Total Bilirubin 1.5 (H) 0.3 - 1.2 mg/dL   GFR calc non Af Amer >60 >60 mL/min   GFR calc Af Amer >60 >60 mL/min   Anion gap 6 5 - 15  Lipase, blood     Status: Abnormal   Collection Time: 12/03/16 12:00 AM  Result Value Ref Range   Lipase 174 (H) 11 - 51 U/L   Imaging Studies: Ct Abdomen Pelvis W Contrast  Result Date: 12/03/2016 CLINICAL DATA:  Sudden onset right upper quadrant abdominal pain tonight. Intermittent pain for 2 weeks. Abnormal  liver function studies and lipase. EXAM: CT ABDOMEN AND PELVIS WITH CONTRAST TECHNIQUE: Multidetector CT imaging of the abdomen and pelvis was performed using the standard protocol following bolus administration of intravenous contrast. CONTRAST:  142mL ISOVUE-300 IOPAMIDOL (ISOVUE-300) INJECTION 61% COMPARISON:  None. FINDINGS: Lower chest: Mild dependent changes in the lung bases. Hepatobiliary: No  focal liver abnormality is seen. No gallstones, gallbladder wall thickening, or biliary dilatation. Pancreas: Unremarkable. No pancreatic ductal dilatation or surrounding inflammatory changes. Spleen: Normal in size without focal abnormality. Adrenals/Urinary Tract: No adrenal gland nodules. Horseshoe kidneys. Renal nephrograms are symmetrical. No hydronephrosis or hydroureter. Bladder wall is not thickened and no bladder filling defects are demonstrated. Stomach/Bowel: Stomach, small bowel, and colon are not abnormally distended. Stool fills the colon. No wall thickening or inflammatory changes. Appendix is normal. Vascular/Lymphatic: No significant vascular findings are present. No enlarged abdominal or pelvic lymph nodes. Reproductive: Prostate is unremarkable. Other: Small left inguinal hernia containing fat. Abdominal wall musculature is otherwise intact. No free air or free fluid in the abdomen. Musculoskeletal: No acute or significant osseous findings. IMPRESSION: No acute process demonstrated in the abdomen or pelvis. No evidence of bowel obstruction or inflammation. 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 Electronically Signed   By: Lucienne Capers M.D.   On: 12/03/2016 04:06    ED COURSE  Nursing notes and initial vitals signs, including pulse oximetry, reviewed.  Vitals:   12/03/16 0215 12/03/16 0230 12/03/16 0300 12/03/16 0330  BP: 133/76 122/82 130/76 134/82  Pulse: 65 68 69 63  Resp: 16     Temp:      TempSrc:      SpO2: 98% 96% 96% 96%  Weight:      Height:       1:57  AM Patient continues to be pain-free. In light of his elevated lipase and LFTs we will get CT of the abdomen and pelvis.  4:34 AM Patient remains pain-free. CT scan without acute process. Suspect elevated LFTs and lipase are related to biliary colic. We will have him return for ultrasound later today.  PROCEDURES    ED DIAGNOSES     ICD-9-CM ICD-10-CM   1. Right lower quadrant abdominal pain 789.03 R10.31     I personally performed the services described in this documentation, which was scribed in my presence. The recorded information has been reviewed and is accurate.     David Rosser, MD 12/03/16 [phone]

## 2016-12-02 NOTE — ED Triage Notes (Signed)
Sudden onset abdominal pain RUQ tonight.  States that he has had intermittent pain x 2 weeks.  States hx of same 7 years ago-was worked up and all tests were negative.  States that pain has now subsided.  Denies N/V/D.

## 2016-12-03 ENCOUNTER — Ambulatory Visit (HOSPITAL_BASED_OUTPATIENT_CLINIC_OR_DEPARTMENT_OTHER)
Admit: 2016-12-03 | Discharge: 2016-12-03 | Disposition: A | Discharge status: Home / Self Care | Payer: BLUE CROSS/BLUE SHIELD | Attending: Emergency Medicine | Admitting: Emergency Medicine

## 2016-12-03 ENCOUNTER — Emergency Department (HOSPITAL_BASED_OUTPATIENT_CLINIC_OR_DEPARTMENT_OTHER): Payer: BLUE CROSS/BLUE SHIELD

## 2016-12-03 DIAGNOSIS — R1031 Right lower quadrant pain: Secondary | ICD-10-CM | POA: Diagnosis not present

## 2016-12-03 DIAGNOSIS — R109 Unspecified abdominal pain: Secondary | ICD-10-CM | POA: Diagnosis not present

## 2016-12-03 DIAGNOSIS — R1011 Right upper quadrant pain: Secondary | ICD-10-CM | POA: Diagnosis not present

## 2016-12-03 LAB — CBC WITH DIFFERENTIAL/PLATELET
BASOS ABS: 0 10*3/uL (ref 0.0–0.1)
BASOS PCT: 0 %
EOS ABS: 0.1 10*3/uL (ref 0.0–0.7)
Eosinophils Relative: 1 %
HEMATOCRIT: 40.1 % (ref 39.0–52.0)
Hemoglobin: 14.4 g/dL (ref 13.0–17.0)
Lymphocytes Relative: 14 %
Lymphs Abs: 1.5 10*3/uL (ref 0.7–4.0)
MCH: 32.4 pg (ref 26.0–34.0)
MCHC: 35.9 g/dL (ref 30.0–36.0)
MCV: 90.3 fL (ref 78.0–100.0)
Monocytes Absolute: 0.7 10*3/uL (ref 0.1–1.0)
Monocytes Relative: 7 %
NEUTROS ABS: 8.5 10*3/uL — AB (ref 1.7–7.7)
NEUTROS PCT: 78 %
Platelets: 232 10*3/uL (ref 150–400)
RBC: 4.44 MIL/uL (ref 4.22–5.81)
RDW: 11.7 % (ref 11.5–15.5)
WBC: 10.7 10*3/uL — AB (ref 4.0–10.5)

## 2016-12-03 LAB — COMPREHENSIVE METABOLIC PANEL
ALBUMIN: 4.3 g/dL (ref 3.5–5.0)
ALK PHOS: 80 U/L (ref 38–126)
ALT: 157 U/L — AB (ref 17–63)
ANION GAP: 6 (ref 5–15)
AST: 238 U/L — AB (ref 15–41)
BILIRUBIN TOTAL: 1.5 mg/dL — AB (ref 0.3–1.2)
BUN: 12 mg/dL (ref 6–20)
CALCIUM: 9.2 mg/dL (ref 8.9–10.3)
CO2: 29 mmol/L (ref 22–32)
CREATININE: 0.72 mg/dL (ref 0.61–1.24)
Chloride: 103 mmol/L (ref 101–111)
GFR calc Af Amer: >60 mL/min (ref >60)
GFR calc non Af Amer: >60 mL/min (ref >60)
GLUCOSE: 131 mg/dL — AB (ref 65–99)
Potassium: 3.6 mmol/L (ref 3.5–5.1)
Sodium: 138 mmol/L (ref 135–145)
Total Protein: 7 g/dL (ref 6.5–8.1)

## 2016-12-03 LAB — LIPASE, BLOOD: Lipase: 174 U/L — ABNORMAL HIGH (ref 11–51)

## 2016-12-03 MED ORDER — IOPAMIDOL (ISOVUE-300) INJECTION 61%
100.0000 mL | Freq: Once | INTRAVENOUS | Status: AC | PRN
  Administered 2016-12-03: 100 mL via INTRAVENOUS

## 2016-12-03 NOTE — ED Notes (Signed)
PT discharged to home NAD.  

## 2016-12-03 NOTE — ED Provider Notes (Signed)
D/w patient findings of RUQ [place].  Pt is pain free at this time and asymptomatic.  D/w Dr. Marlou Starks with surgery - recommends follow up in office but prompt return if recurrent symptoms.     Quintella Reichert, MD 12/04/16 [phone]

## 2016-12-03 NOTE — ED Notes (Signed)
Pt alert, NAD, calm, interactive, resps e/u, no dyspnea noted, (denies pain, sob, nausea or dizziness). Pending CT.

## 2016-12-04 ENCOUNTER — Encounter: Payer: Self-pay | Admitting: Internal Medicine

## 2016-12-04 ENCOUNTER — Ambulatory Visit (INDEPENDENT_AMBULATORY_CARE_PROVIDER_SITE_OTHER): Payer: 59 | Admitting: Internal Medicine

## 2016-12-04 VITALS — BP 122/78 | HR 77 | Temp 98.7°F | Resp 14 | Ht 73.0 in | Wt 204.0 lb

## 2016-12-04 DIAGNOSIS — K8020 Calculus of gallbladder without cholecystitis without obstruction: Secondary | ICD-10-CM | POA: Diagnosis not present

## 2016-12-04 LAB — COMPREHENSIVE METABOLIC PANEL
ALBUMIN: 4.6 g/dL (ref 3.5–5.2)
ALK PHOS: 86 U/L (ref 39–117)
ALT: 115 U/L — AB (ref 0–53)
AST: 50 U/L — AB (ref 0–37)
BILIRUBIN TOTAL: 0.7 mg/dL (ref 0.2–1.2)
BUN: 12 mg/dL (ref 6–23)
CALCIUM: 9.4 mg/dL (ref 8.4–10.5)
CO2: 28 mEq/L (ref 19–32)
CREATININE: 0.77 mg/dL (ref 0.40–1.50)
Chloride: 102 mEq/L (ref 96–112)
GFR: 116.8 mL/min (ref >60.00)
Glucose, Bld: 102 mg/dL — ABNORMAL HIGH (ref 70–99)
Potassium: 3.8 mEq/L (ref 3.5–5.1)
SODIUM: 138 meq/L (ref 135–145)
TOTAL PROTEIN: 7.5 g/dL (ref 6.0–8.3)

## 2016-12-04 LAB — CBC WITH DIFFERENTIAL/PLATELET
BASOS ABS: 0.1 10*3/uL (ref 0.0–0.1)
Basophils Relative: 0.6 % (ref 0.0–3.0)
EOS ABS: 0.2 10*3/uL (ref 0.0–0.7)
Eosinophils Relative: 1.8 % (ref 0.0–5.0)
HEMATOCRIT: 43.5 % (ref 39.0–52.0)
HEMOGLOBIN: 15 g/dL (ref 13.0–17.0)
LYMPHS PCT: 12.4 % (ref 12.0–46.0)
Lymphs Abs: 1.7 10*3/uL (ref 0.7–4.0)
MCHC: 34.6 g/dL (ref 30.0–36.0)
MCV: 92.6 fl (ref 78.0–100.0)
MONO ABS: 0.8 10*3/uL (ref 0.1–1.0)
Monocytes Relative: 6.1 % (ref 3.0–12.0)
Neutro Abs: 10.9 10*3/uL — ABNORMAL HIGH (ref 1.4–7.7)
Neutrophils Relative %: 79.1 % — ABNORMAL HIGH (ref 43.0–77.0)
Platelets: 232 10*3/uL (ref 150.0–400.0)
RBC: 4.69 Mil/uL (ref 4.22–5.81)
RDW: 12.4 % (ref 11.5–15.5)
WBC: 13.8 10*3/uL — AB (ref 4.0–10.5)

## 2016-12-04 LAB — LIPASE: LIPASE: 109 U/L — AB (ref 11.0–59.0)

## 2016-12-04 NOTE — Assessment & Plan Note (Signed)
Cholelithiasis: sx of cholelithiasis for 3 or 4 weeks, on and off, worse episode was 2 days ago, currently is essentially asymptomatic. At the ER, LFTs were elevated. He has an appointment to see surgery 12/13/2016. Plan: Labs, keep surgery appointment, ER if symptoms resurface, recommend bland diet with small portions.

## 2016-12-04 NOTE — Patient Instructions (Signed)
Get your labs  Keep your appointment to see the surgeon 12/13/2016  ER if severe pain, fever, chills

## 2016-12-04 NOTE — Progress Notes (Signed)
Pre visit review using our clinic review tool, if applicable. No additional management support is needed unless otherwise documented below in the visit note. 

## 2016-12-04 NOTE — Progress Notes (Signed)
Subjective:    Patient ID: David Lowery, male    DOB: Feb 21, 1971, 45 y.o.   MRN: [id number]  DOS:  12/04/2016 Type of visit - description :  Interval history: Went to the ER With episodic right-sided abdominal pain for 3 weeks. Chart reviewed At the ER, LFTs were elevated, ultrasound show a gallbladder stone, CT of the abdomen show no additional abnormalities.   Review of Systems Since he left the hospital, denies fever, chills. Has minimal right-sided abdominal discomfort. No nausea, vomiting, diarrhea. No dysuria or gross hematuria.  Past Medical History:  Diagnosis Date  . Anxiety   . OSA (obstructive sleep apnea)    initial sx: palpitations-fatigue--s/p surgery, much improved   . Seasonal allergies     Past Surgical History:  Procedure Laterality Date  . THROAT SURGERY  2011   tonsilectomy, uvulectomy d/t OSA     Social History   Social History  . Marital status: Married    Spouse name: N/A  . Number of children: 1  . Years of education: N/A   Occupational History  . Astronomer    [employer]   Social History Main Topics  . Smoking status: Never Smoker  . Smokeless tobacco: Never Used  . Alcohol use Yes     Comment: socially   . Drug use: Unknown  . Sexual activity: Not on file   Other Topics Concern  . Not on file   Social History Narrative   Moved to [facility] July 2014, from [employer]   Son born ~ 2012      Allergies as of 12/04/2016      Reactions   Sudafed [pseudoephedrine] Anxiety, Palpitations      Medication List       Accurate as of 12/04/16  9:05 PM. Always use your most recent med list.          fexofenadine 30 MG tablet Commonly known as:  ALLEGRA Take 30 mg by mouth daily as needed.   sertraline 100 MG tablet Commonly known as:  ZOLOFT Take 1 tablet (100 mg total) by mouth daily.          Objective:   Physical Exam BP 122/78 (BP Location: Left Arm, Patient Position: Sitting, Cuff  Size: Normal)   Pulse 77   Temp 98.7 F (37.1 C) (Oral)   Resp 14   Ht 6\' 1"  (1.854 m)   Wt 204 lb (92.5 kg)   SpO2 98%   BMI 26.91 kg/m  General:   Well developed, well nourished . NAD.  HEENT:  Normocephalic . Face symmetric, atraumatic Lungs:  CTA B Normal respiratory effort, no intercostal retractions, no accessory muscle use. Heart: RRR,  no murmur.  no pretibial edema bilaterally  Abdomen:  Not distended, soft, non-tender. No rebound or rigidity.  Skin: Not pale. Not jaundice Neurologic:  alert & oriented X3.  Speech normal, gait appropriate for age and unassisted Psych--  Cognition and judgment appear intact.  Cooperative with normal attention span and concentration.  Behavior appropriate. No anxious or depressed appearing.    Assessment & Plan:   Assessment OSA -initial sx: palpitations-fatigue--s/p surgery, much improved  Anxiety Allergies Hernia L ----- saw surgery 2015, offered surgery Actinic keratosis, nevus -- saw derm, excisions 2015   Plan Cholelithiasis: sx of cholelithiasis for 3 or 4 weeks, on and off, worse episode was 2 days ago, currently is essentially asymptomatic. At the ER, LFTs were elevated. He has an appointment  to see surgery 12/13/2016. Plan: Labs, keep surgery appointment, ER if symptoms resurface, recommend bland diet with small portions.

## 2016-12-05 ENCOUNTER — Ambulatory Visit: Payer: Self-pay | Admitting: General Surgery

## 2016-12-05 ENCOUNTER — Encounter (HOSPITAL_COMMUNITY): Payer: Self-pay | Admitting: *Deleted

## 2016-12-06 ENCOUNTER — Ambulatory Visit (HOSPITAL_COMMUNITY)
Admission: RE | Admit: 2016-12-06 | Discharge: 2016-12-06 | Disposition: A | Discharge status: Home / Self Care | Payer: 59 | Source: Ambulatory Visit | Attending: General Surgery | Admitting: General Surgery

## 2016-12-06 ENCOUNTER — Encounter (HOSPITAL_COMMUNITY)
Admission: RE | Disposition: A | Discharge status: Home / Self Care | Payer: Self-pay | Source: Ambulatory Visit | Attending: General Surgery

## 2016-12-06 ENCOUNTER — Encounter (HOSPITAL_COMMUNITY): Payer: Self-pay | Admitting: Urology

## 2016-12-06 ENCOUNTER — Ambulatory Visit (HOSPITAL_COMMUNITY): Payer: 59 | Admitting: Certified Registered Nurse Anesthetist

## 2016-12-06 ENCOUNTER — Ambulatory Visit (HOSPITAL_COMMUNITY): Payer: 59

## 2016-12-06 DIAGNOSIS — K851 Biliary acute pancreatitis without necrosis or infection: Secondary | ICD-10-CM

## 2016-12-06 DIAGNOSIS — K8010 Calculus of gallbladder with chronic cholecystitis without obstruction: Secondary | ICD-10-CM | POA: Insufficient documentation

## 2016-12-06 DIAGNOSIS — G4730 Sleep apnea, unspecified: Secondary | ICD-10-CM | POA: Diagnosis not present

## 2016-12-06 DIAGNOSIS — K859 Acute pancreatitis without necrosis or infection, unspecified: Secondary | ICD-10-CM | POA: Insufficient documentation

## 2016-12-06 DIAGNOSIS — R109 Unspecified abdominal pain: Secondary | ICD-10-CM | POA: Diagnosis present

## 2016-12-06 HISTORY — DX: Unspecified asthma, uncomplicated: J45.909

## 2016-12-06 HISTORY — PX: CHOLECYSTECTOMY: SHX55

## 2016-12-06 SURGERY — LAPAROSCOPIC CHOLECYSTECTOMY WITH INTRAOPERATIVE CHOLANGIOGRAM
Anesthesia: General | Site: Abdomen

## 2016-12-06 MED ORDER — ACETAMINOPHEN 500 MG PO TABS
ORAL_TABLET | ORAL | Status: AC
  Administered 2016-12-06: 1000 mg via ORAL
  Filled 2016-12-06: qty 2

## 2016-12-06 MED ORDER — HYDROMORPHONE HCL 1 MG/ML IJ SOLN: 0.2500 mg | INTRAMUSCULAR | Status: DC | PRN

## 2016-12-06 MED ORDER — SUGAMMADEX SODIUM 200 MG/2ML IV SOLN
INTRAVENOUS | Status: DC | PRN
  Administered 2016-12-06: 200 mg via INTRAVENOUS

## 2016-12-06 MED ORDER — CHLORHEXIDINE GLUCONATE CLOTH 2 % EX PADS: 6.0000 | MEDICATED_PAD | Freq: Once | CUTANEOUS | Status: DC

## 2016-12-06 MED ORDER — EPHEDRINE SULFATE 50 MG/ML IJ SOLN
INTRAMUSCULAR | Status: DC | PRN
  Administered 2016-12-06: 10 mg via INTRAVENOUS

## 2016-12-06 MED ORDER — SUCCINYLCHOLINE CHLORIDE 200 MG/10ML IV SOSY
PREFILLED_SYRINGE | INTRAVENOUS | Status: DC | PRN
  Administered 2016-12-06: 20 mg via INTRAVENOUS

## 2016-12-06 MED ORDER — CEFAZOLIN SODIUM-DEXTROSE 2-4 GM/100ML-% IV SOLN
2.0000 g | INTRAVENOUS | Status: AC
  Administered 2016-12-06: 2 g via INTRAVENOUS

## 2016-12-06 MED ORDER — MIDAZOLAM HCL 2 MG/2ML IJ SOLN
INTRAMUSCULAR | Status: AC
  Filled 2016-12-06: qty 2

## 2016-12-06 MED ORDER — BUPIVACAINE HCL 0.25 % IJ SOLN
INTRAMUSCULAR | Status: DC | PRN
  Administered 2016-12-06: 16 mL

## 2016-12-06 MED ORDER — SODIUM CHLORIDE 0.9 % IR SOLN
Status: DC | PRN
  Administered 2016-12-06: 1000 mL

## 2016-12-06 MED ORDER — LIDOCAINE HCL (CARDIAC) 20 MG/ML IV SOLN
INTRAVENOUS | Status: DC | PRN
  Administered 2016-12-06: 60 mg via INTRATRACHEAL
  Administered 2016-12-06: 100 mg via INTRAVENOUS

## 2016-12-06 MED ORDER — SODIUM CHLORIDE 0.9 % IV SOLN
INTRAVENOUS | Status: DC | PRN
  Administered 2016-12-06: 5 mL

## 2016-12-06 MED ORDER — KETOROLAC TROMETHAMINE 30 MG/ML IJ SOLN: 30.0000 mg | Freq: Once | INTRAMUSCULAR | Status: AC

## 2016-12-06 MED ORDER — BUPIVACAINE HCL (PF) 0.25 % IJ SOLN
INTRAMUSCULAR | Status: AC
  Filled 2016-12-06: qty 30

## 2016-12-06 MED ORDER — DIPHENHYDRAMINE HCL 50 MG/ML IJ SOLN
INTRAMUSCULAR | Status: DC | PRN
  Administered 2016-12-06: 12.5 mg via INTRAVENOUS

## 2016-12-06 MED ORDER — CELECOXIB 200 MG PO CAPS
400.0000 mg | ORAL_CAPSULE | ORAL | Status: AC
  Administered 2016-12-06: 400 mg via ORAL

## 2016-12-06 MED ORDER — PROMETHAZINE HCL 25 MG/ML IJ SOLN: 6.2500 mg | INTRAMUSCULAR | Status: DC | PRN

## 2016-12-06 MED ORDER — MIDAZOLAM HCL 5 MG/5ML IJ SOLN
INTRAMUSCULAR | Status: DC | PRN
  Administered 2016-12-06: 2 mg via INTRAVENOUS

## 2016-12-06 MED ORDER — GABAPENTIN 300 MG PO CAPS
300.0000 mg | ORAL_CAPSULE | ORAL | Status: AC
  Administered 2016-12-06: 300 mg via ORAL

## 2016-12-06 MED ORDER — DEXAMETHASONE SODIUM PHOSPHATE 10 MG/ML IJ SOLN
INTRAMUSCULAR | Status: DC | PRN
  Administered 2016-12-06: 5 mg via INTRAVENOUS

## 2016-12-06 MED ORDER — LACTATED RINGERS IV SOLN
INTRAVENOUS | Status: DC
  Administered 2016-12-06 (×2): via INTRAVENOUS

## 2016-12-06 MED ORDER — 0.9 % SODIUM CHLORIDE (POUR BTL) OPTIME
TOPICAL | Status: DC | PRN
Start: 2016-12-06 — End: 2016-12-06
  Administered 2016-12-06: 1000 mL

## 2016-12-06 MED ORDER — GABAPENTIN 300 MG PO CAPS
ORAL_CAPSULE | ORAL | Status: AC
  Administered 2016-12-06: 300 mg via ORAL
  Filled 2016-12-06: qty 1

## 2016-12-06 MED ORDER — ONDANSETRON HCL 4 MG/2ML IJ SOLN
INTRAMUSCULAR | Status: DC | PRN
  Administered 2016-12-06: 4 mg via INTRAVENOUS

## 2016-12-06 MED ORDER — HYDROCODONE-ACETAMINOPHEN 5-325 MG PO TABS
ORAL_TABLET | ORAL | Status: AC
  Filled 2016-12-06: qty 1

## 2016-12-06 MED ORDER — FENTANYL CITRATE (PF) 100 MCG/2ML IJ SOLN
INTRAMUSCULAR | Status: AC
  Filled 2016-12-06: qty 2

## 2016-12-06 MED ORDER — CELECOXIB 200 MG PO CAPS
ORAL_CAPSULE | ORAL | Status: AC
  Administered 2016-12-06: 400 mg via ORAL
  Filled 2016-12-06: qty 2

## 2016-12-06 MED ORDER — FENTANYL CITRATE (PF) 100 MCG/2ML IJ SOLN
INTRAMUSCULAR | Status: DC | PRN
  Administered 2016-12-06 (×2): 50 ug via INTRAVENOUS

## 2016-12-06 MED ORDER — MEPERIDINE HCL 25 MG/ML IJ SOLN: 6.2500 mg | INTRAMUSCULAR | Status: DC | PRN

## 2016-12-06 MED ORDER — CEFAZOLIN SODIUM-DEXTROSE 2-4 GM/100ML-% IV SOLN
INTRAVENOUS | Status: AC
  Filled 2016-12-06: qty 100

## 2016-12-06 MED ORDER — ACETAMINOPHEN 500 MG PO TABS
1000.0000 mg | ORAL_TABLET | ORAL | Status: AC
  Administered 2016-12-06: 1000 mg via ORAL

## 2016-12-06 MED ORDER — ROCURONIUM BROMIDE 100 MG/10ML IV SOLN
INTRAVENOUS | Status: DC | PRN
  Administered 2016-12-06: 40 mg via INTRAVENOUS

## 2016-12-06 MED ORDER — HYDROCODONE-ACETAMINOPHEN 5-325 MG PO TABS: 1.0000 | ORAL_TABLET | ORAL | 0 refills | Status: DC | PRN

## 2016-12-06 MED ORDER — PROPOFOL 10 MG/ML IV BOLUS
INTRAVENOUS | Status: DC | PRN
  Administered 2016-12-06: 200 mg via INTRAVENOUS

## 2016-12-06 MED ORDER — IOPAMIDOL (ISOVUE-300) INJECTION 61%
INTRAVENOUS | Status: AC
  Filled 2016-12-06: qty 50

## 2016-12-06 MED ORDER — HYDROCODONE-ACETAMINOPHEN 5-325 MG PO TABS
1.0000 | ORAL_TABLET | Freq: Once | ORAL | Status: AC
  Administered 2016-12-06: 1 via ORAL

## 2016-12-06 MED ORDER — GLYCOPYRROLATE 0.2 MG/ML IJ SOLN
INTRAMUSCULAR | Status: DC | PRN
  Administered 2016-12-06: 0.2 mg via INTRAVENOUS
  Administered 2016-12-06 (×2): 0.1 mg via INTRAVENOUS

## 2016-12-06 SURGICAL SUPPLY — 39 items
APPLIER CLIP 5 13 M/L LIGAMAX5 (MISCELLANEOUS) ×2
BLADE SURG ROTATE 9660 (MISCELLANEOUS) ×2 IMPLANT
CANISTER SUCTION 2500CC (MISCELLANEOUS) ×2 IMPLANT
CATH REDDICK CHOLANGI 4FR 50CM (CATHETERS) ×2 IMPLANT
CHLORAPREP W/TINT 26ML (MISCELLANEOUS) ×2 IMPLANT
CLIP APPLIE 5 13 M/L LIGAMAX5 (MISCELLANEOUS) ×1 IMPLANT
COVER MAYO STAND STRL (DRAPES) ×2 IMPLANT
COVER SURGICAL LIGHT HANDLE (MISCELLANEOUS) ×2 IMPLANT
DERMABOND ADVANCED (GAUZE/BANDAGES/DRESSINGS) ×1
DERMABOND ADVANCED .7 DNX12 (GAUZE/BANDAGES/DRESSINGS) ×1 IMPLANT
DRAPE C-ARM 42X72 X-RAY (DRAPES) ×2 IMPLANT
ELECT REM PT RETURN 9FT ADLT (ELECTROSURGICAL) ×2
ELECTRODE REM PT RTRN 9FT ADLT (ELECTROSURGICAL) ×1 IMPLANT
GLOVE BIO SURGEON STRL SZ7.5 (GLOVE) ×4 IMPLANT
GLOVE BIOGEL PI IND STRL 6.5 (GLOVE) ×2 IMPLANT
GLOVE BIOGEL PI IND STRL 7.5 (GLOVE) ×1 IMPLANT
GLOVE BIOGEL PI INDICATOR 6.5 (GLOVE) ×2
GLOVE BIOGEL PI INDICATOR 7.5 (GLOVE) ×1
GLOVE ECLIPSE 6.0 STRL STRAW (GLOVE) ×2 IMPLANT
GLOVE SURG SS PI 6.0 STRL IVOR (GLOVE) ×2 IMPLANT
GOWN STRL REUS W/ TWL LRG LVL3 (GOWN DISPOSABLE) ×4 IMPLANT
GOWN STRL REUS W/TWL LRG LVL3 (GOWN DISPOSABLE) ×4
IV CATH 14GX2 1/4 (CATHETERS) ×2 IMPLANT
KIT BASIN OR (CUSTOM PROCEDURE TRAY) ×2 IMPLANT
KIT ROOM TURNOVER OR (KITS) ×2 IMPLANT
NS IRRIG 1000ML POUR BTL (IV SOLUTION) ×2 IMPLANT
PAD ARMBOARD 7.5X6 YLW CONV (MISCELLANEOUS) ×2 IMPLANT
POUCH SPECIMEN RETRIEVAL 10MM (ENDOMECHANICALS) ×2 IMPLANT
SCISSORS LAP 5X35 DISP (ENDOMECHANICALS) ×2 IMPLANT
SET IRRIG TUBING LAPAROSCOPIC (IRRIGATION / IRRIGATOR) ×2 IMPLANT
SLEEVE ENDOPATH XCEL 5M (ENDOMECHANICALS) ×4 IMPLANT
SPECIMEN JAR SMALL (MISCELLANEOUS) ×2 IMPLANT
SUT MNCRL AB 4-0 PS2 18 (SUTURE) ×4 IMPLANT
TOWEL OR 17X24 6PK STRL BLUE (TOWEL DISPOSABLE) ×2 IMPLANT
TOWEL OR 17X26 10 PK STRL BLUE (TOWEL DISPOSABLE) ×2 IMPLANT
TRAY LAPAROSCOPIC MC (CUSTOM PROCEDURE TRAY) ×2 IMPLANT
TROCAR XCEL BLUNT TIP 100MML (ENDOMECHANICALS) ×2 IMPLANT
TROCAR XCEL NON-BLD 5MMX100MML (ENDOMECHANICALS) ×2 IMPLANT
TUBING INSUFFLATION (TUBING) ×2 IMPLANT

## 2016-12-06 NOTE — Op Note (Signed)
12/06/2016  3:54 PM  PATIENT:  David Lowery  46 y.o. male  PRE-OPERATIVE DIAGNOSIS:  GALLSTONE PANCREATITIS  POST-OPERATIVE DIAGNOSIS:  GALLSTONE PANCREATITIS  PROCEDURE:  Procedure(s): LAPAROSCOPIC CHOLECYSTECTOMY WITH INTRAOPERATIVE CHOLANGIOGRAM (N/A)  SURGEON:  Surgeon(s) and Role:    * Jovita Kussmaul, MD - Primary  PHYSICIAN ASSISTANT:   ASSISTANTS: Sharyn Dross, RNFA   ANESTHESIA:   local and general  EBL:  Total I/O In: 1000 [I.V.:1000] Out: 30 [Blood:30]  BLOOD ADMINISTERED:none  DRAINS: none   LOCAL MEDICATIONS USED:  MARCAINE     SPECIMEN:  Source of Specimen:  gallbladder  DISPOSITION OF SPECIMEN:  PATHOLOGY  COUNTS:  YES  TOURNIQUET:  * No tourniquets in log *  DICTATION: .Dragon Dictation   Procedure: After informed consent was obtained the patient was brought to the operating room and placed in the supine position on the operating room table. After adequate induction of general anesthesia the patient's abdomen was prepped with ChloraPrep allowed to dry and draped in usual sterile manner. The area below the umbilicus was infiltrated with quarter percent  Marcaine. A small incision was made with a 15 blade knife. The incision was carried down through the subcutaneous tissue bluntly with a hemostat and Army-Nav[employer] retractors. The linea alba was identified. The linea alba was incised with a 15 blade knife and each side was grasped with Coker clamps. The preperitoneal space was then probed with a hemostat until the peritoneum was opened and access was gained to the abdominal cavity. A 0 Vicryl pursestring stitch was placed in the fascia surrounding the opening. A Hassan cannula was then placed through the opening and anchored in place with the previously placed Vicryl purse string stitch. The abdomen was insufflated with carbon dioxide without difficulty. A laparoscope was inserted through the [facility] cannula in the right upper quadrant was inspected. Next the  epigastric region was infiltrated with % Marcaine. A small incision was made with a 15 blade knife. A 5 mm port was placed bluntly through this incision into the abdominal cavity under direct vision. Next 2 sites were chosen laterally on the right side of the abdomen for placement of 5 mm ports. Each of these areas was infiltrated with quarter percent Marcaine. Small stab incisions were made with a 15 blade knife. 5 mm ports were then placed bluntly through these incisions into the abdominal cavity under direct vision without difficulty. A blunt grasper was placed through the lateralmost 5 mm port and used to grasp the dome of the gallbladder and elevated anteriorly and superiorly. Another blunt grasper was placed through the other 5 mm port and used to retract the body and neck of the gallbladder. A dissector was placed through the epigastric port and using the electrocautery the peritoneal reflection at the gallbladder neck was opened. Blunt dissection was then carried out in this area until the gallbladder neck-cystic duct junction was readily identified and a good window was created. A single clip was placed on the gallbladder neck. A small  ductotomy was made just below the clip with laparoscopic scissors. A 14-gauge Angiocath was then placed through the anterior abdominal wall under direct vision. A Reddick cholangiogram catheter was then placed through the Angiocath and flushed. The catheter was then placed in the cystic duct and anchored in place with a clip. A cholangiogram was obtained that showed no filling defects good emptying into the duodenum an adequate length on the cystic duct. The anchoring clip and catheters were then removed from the patient.  3 clips were placed proximally on the cystic duct and the duct was divided between the 2 sets of clips. Posterior to this the cystic artery was identified and again dissected bluntly in a circumferential manner until a good window  was created. 2 clips  were placed proximally and one distally on the artery and the artery was divided between the 2 sets of clips. Next a laparoscopic hook cautery device was used to separate the gallbladder from the liver bed. Prior to completely detaching the gallbladder from the liver bed the liver bed was inspected and several small bleeding points were coagulated with the electrocautery until the area was completely hemostatic. The gallbladder was then detached the rest of it from the liver bed without difficulty. A laparoscopic bag was inserted through the hassan port. The laparoscope was moved to the epigastric port. The gallbladder was placed within the bag and the bag was sealed.  The bag with the gallbladder was then removed with the [facility] cannula through the infraumbilical port without difficulty. The fascial defect was then closed with the previously placed Vicryl pursestring stitch as well as with another figure-of-eight 0 Vicryl stitch. The liver bed was inspected again and found to be hemostatic. The abdomen was irrigated with copious amounts of saline until the effluent was clear. The ports were then removed under direct vision without difficulty and were found to be hemostatic. The gas was allowed to escape. The skin incisions were all closed with interrupted 4-0 Monocryl subcuticular stitches. Dermabond dressings were applied. The patient tolerated the procedure well. At the end of the case all needle sponge and instrument counts were correct. The patient was then awakened and taken to recovery in stable condition  PLAN OF CARE: Discharge to home after PACU  PATIENT DISPOSITION:  PACU - hemodynamically stable.   Delay start of Pharmacological VTE agent (>24hrs) due to surgical blood loss or risk of bleeding: not applicable

## 2016-12-06 NOTE — Anesthesia Preprocedure Evaluation (Signed)
Anesthesia Evaluation  Patient identified by MRN, date of birth, ID band Patient awake    Reviewed: Allergy & Precautions, H&P , NPO status , Patient's Chart, lab work & pertinent test results, reviewed documented beta blocker date and time   Airway Mallampati: I  TM Distance: >3 FB Neck ROM: full    Dental no notable dental hx. (+) Teeth Intact   Pulmonary    Pulmonary exam normal        Cardiovascular negative cardio ROS Normal cardiovascular exam     Neuro/Psych negative neurological ROS     GI/Hepatic negative GI ROS, Neg liver ROS,   Endo/Other  negative endocrine ROS  Renal/GU negative Renal ROS     Musculoskeletal   Abdominal Normal abdominal exam  (+)   Peds  Hematology negative hematology ROS (+)   Anesthesia Other Findings   Reproductive/Obstetrics negative OB ROS                             Anesthesia Physical Anesthesia Plan  ASA: II  Anesthesia Plan: General   Post-op Pain Management:    Induction: Intravenous  Airway Management Planned: Oral ETT  Additional Equipment:   Intra-op Plan:   Post-operative Plan: Extubation in OR  Informed Consent: I have reviewed the patients History and Physical, chart, labs and discussed the procedure including the risks, benefits and alternatives for the proposed anesthesia with the patient or authorized representative who has indicated his/her understanding and acceptance.   Dental Advisory Given  Plan Discussed with: CRNA and Surgeon  Anesthesia Plan Comments:         Anesthesia Quick Evaluation

## 2016-12-06 NOTE — OR Nursing (Signed)
Pts wife needed to pick up daughter. We are holding pt until she returns as timing a little soon to accommodate. Instructions already recvd by wife from [facility] and patient care complete now.

## 2016-12-06 NOTE — Anesthesia Procedure Notes (Signed)
Procedure Name: Intubation Date/Time: 12/06/2016 2:08 PM Performed by: Manuela Schwartz B Pre-anesthesia Checklist: Patient identified, Emergency Drugs available, Suction available, Patient being monitored and Timeout performed Patient Re-evaluated:Patient Re-evaluated prior to inductionOxygen Delivery Method: Circle system utilized Preoxygenation: Pre-oxygenation with 100% oxygen Intubation Type: IV induction Ventilation: Mask ventilation without difficulty Laryngoscope Size: Mac and 3 Grade View: Grade I Tube type: Oral Number of attempts: 1 Airway Equipment and Method: Stylet and LTA kit utilized Placement Confirmation: ETT inserted through vocal cords under direct vision,  positive ETCO2 and breath sounds checked- equal and bilateral Secured at: 22 cm Tube secured with: Tape Dental Injury: Teeth and Oropharynx as per pre-operative assessment

## 2016-12-06 NOTE — Transfer of Care (Signed)
Immediate Anesthesia Transfer of Care Note  Patient: David Lowery  Procedure(s) Performed: Procedure(s): LAPAROSCOPIC CHOLECYSTECTOMY WITH INTRAOPERATIVE CHOLANGIOGRAM (N/A)  Patient Location: PACU  Anesthesia Type:General  Level of Consciousness: awake, alert  and oriented  Airway & Oxygen Therapy: Patient Spontanous Breathing and Patient connected to nasal cannula oxygen  Post-op Assessment: Report given to RN and Post -op Vital signs reviewed and stable  Post vital signs: Reviewed and stable  Last Vitals:  Vitals:   12/06/16 1554 12/06/16 1608  BP:    Pulse: 90 81  Resp: 18 14  Temp: 36.2 C     Last Pain:  Vitals:   12/06/16 1554  TempSrc:   PainSc: 1       Patients Stated Pain Goal: 6 ([id number])  Complications: No apparent anesthesia complications

## 2016-12-06 NOTE — Progress Notes (Signed)
Report  To R. Mancel Bale RN as primary caregiver.

## 2016-12-06 NOTE — H&P (Signed)
David Lowery  Location: [facility] Surgery Patient #: [id number] DOB: 1971-07-06 Married / Language: English / Race: White Male   History of Present Illness The patient is a 46 year old male who presents with abdominal pain. We are asked to see the patient in consultation by Dr. Belinda Fisher to evaluate him for gallstones. The patient is a 46 year old white male who was in the emergency department just 2 days ago with right upper quadrant pain. He denied any nausea or vomiting. The pain he described was severe. The pain lasted for a couple hours before resolving. In retrospect he probably had a similar episode of this about 7 years ago. He underwent a CT scan which did not show any gallstones but the ultrasound did show small gallstones and a 2.4 cm gallstone near the neck of the gallbladder. The liver functions were elevated in the emergency department but they were repeated at his doctor's office yesterday and they are returning towards normal. His pancreatic enzymes were also elevated but are normalizing. He feels good today without any abdominal pain.   Past Surgical History  Tonsillectomy   Diagnostic Studies History  Colonoscopy  never  Allergies  Sudafed Cough *COUGH/COLD/ALLERGY*   Medication History  Sertraline HCl (100MG  Tablet, Oral) Active. Allegra (30MG  Tablet, Oral) Active. Medications Reconciled  Social History  Alcohol use  Occasional alcohol use. Caffeine use  Carbonated beverages, Tea. No drug use  Tobacco use  Never smoker.  Family History  Colon Cancer  Family Members In General. Depression  Mother.  Other Problems  Anxiety Disorder  Asthma  Inguinal Hernia  Sleep Apnea     Review of Systems  General Not Present- Appetite Loss, Chills, Fatigue, Fever, Night Sweats, Weight Gain and Weight Loss. Skin Not Present- Change in Wart/Mole, Dryness, Hives, Jaundice, New Lesions, Non-Healing Wounds, Rash and Ulcer. HEENT Present-  Seasonal Allergies and Sore Throat. Not Present- Earache, Hearing Loss, Hoarseness, Nose Bleed, Oral Ulcers, Ringing in the Ears, Sinus Pain, Visual Disturbances, Wears glasses/contact lenses and Yellow Eyes. Respiratory Not Present- Bloody sputum, Chronic Cough, Difficulty Breathing, Snoring and Wheezing. Breast Not Present- Breast Mass, Breast Pain, Nipple Discharge and Skin Changes. Cardiovascular Not Present- Chest Pain, Difficulty Breathing Lying Down, Leg Cramps, Palpitations, Rapid Heart Rate, Shortness of Breath and Swelling of Extremities. Gastrointestinal Present- Abdominal Pain and Indigestion. Not Present- Bloating, Bloody Stool, Change in Bowel Habits, Chronic diarrhea, Constipation, Difficulty Swallowing, Excessive gas, Gets full quickly at meals, Hemorrhoids, Nausea, Rectal Pain and Vomiting. Male Genitourinary Not Present- Blood in Urine, Change in Urinary Stream, Frequency, Impotence, Nocturia, Painful Urination, Urgency and Urine Leakage.  Vitals Weight: 201 lb Height: 72in Body Surface Area: 2.13 m Body Mass Index: 27.26 kg/m  Pulse: 80 (Regular)  BP: 128/82 (Sitting, Left Arm, Standard)       Physical Exam  General Mental Status-Alert. General Appearance-Consistent with stated age. Hydration-Well hydrated. Voice-Normal.  Head and Neck Head-normocephalic, atraumatic with no lesions or palpable masses. Trachea-midline. Thyroid Gland Characteristics - normal size and consistency.  Eye Eyeball - Bilateral-Extraocular movements intact. Sclera/Conjunctiva - Bilateral-No scleral icterus.  Chest and Lung Exam Chest and lung exam reveals -quiet, even and easy respiratory effort with no use of accessory muscles and on auscultation, normal breath sounds, no adventitious sounds and normal vocal resonance. Inspection Chest Wall - Normal. Back - normal.  Cardiovascular Cardiovascular examination reveals -normal heart sounds, regular rate  and rhythm with no murmurs and normal pedal pulses bilaterally.  Abdomen Inspection Inspection of the abdomen  reveals - No Hernias. Skin - Scar - no surgical scars. Palpation/Percussion Palpation and Percussion of the abdomen reveal - Soft, Non Tender, No Rebound tenderness, No Rigidity (guarding) and No hepatosplenomegaly. Auscultation Auscultation of the abdomen reveals - Bowel sounds normal.  Neurologic Neurologic evaluation reveals -alert and oriented x 3 with no impairment of recent or remote memory. Mental Status-Normal.  Musculoskeletal Normal Exam - Left-Upper Extremity Strength Normal and Lower Extremity Strength Normal. Normal Exam - Right-Upper Extremity Strength Normal and Lower Extremity Strength Normal.  Lymphatic Head & Neck  General Head & Neck Lymphatics: Bilateral - Description - Normal. Axillary  General Axillary Region: Bilateral - Description - Normal. Tenderness - Non Tender. Femoral & Inguinal  Generalized Femoral & Inguinal Lymphatics: Bilateral - Description - Normal. Tenderness - Non Tender.    Assessment & Plan GALLSTONE PANCREATITIS (K85.10) Impression: The patient appears to have had a recent episode of gallstone pancreatitis which is resolving. Because of the risk of further painful episodes I think he would benefit from having his gallbladder removed. He would also like to have this done. I have discussed with him in detail the risks and benefits of the operation of removing the gallbladder as well as some of the technical aspects and he understands and wishes to proceed. I will plan for a laparoscopic cholecystectomy with intraoperative angiogram tomorrow. Current Plans Pt Education - Pancreatitis: discussed with patient and provided information.

## 2016-12-06 NOTE — Anesthesia Postprocedure Evaluation (Addendum)
Anesthesia Post Note  Patient: David Lowery  Procedure(s) Performed: Procedure(s) (LRB): LAPAROSCOPIC CHOLECYSTECTOMY WITH INTRAOPERATIVE CHOLANGIOGRAM (N/A)  Patient location during evaluation: PACU Anesthesia Type: General Level of consciousness: awake and sedated Pain management: pain level controlled Vital Signs Assessment: post-procedure vital signs reviewed and stable Respiratory status: spontaneous breathing Cardiovascular status: stable Postop Assessment: no signs of nausea or vomiting Anesthetic complications: no        Last Vitals:  Vitals:   12/06/16 1630 12/06/16 1635  BP:  128/86  Pulse: 87 79  Resp: 17 10  Temp:      Last Pain:  Vitals:   12/06/16 1630  TempSrc:   PainSc: 1    Pain Goal: Patients Stated Pain Goal: 6 (12/06/16 1210)               Leilene Diprima JR,JOHN Mateo Flow

## 2016-12-06 NOTE — Interval H&P Note (Signed)
History and Physical Interval Note:  12/06/2016 1:40 PM  David Lowery  has presented today for surgery, with the diagnosis of GALLSTONE PANCREATITIS  The various methods of treatment have been discussed with the patient and family. After consideration of risks, benefits and other options for treatment, the patient has consented to  Procedure(s): LAPAROSCOPIC CHOLECYSTECTOMY WITH INTRAOPERATIVE CHOLANGIOGRAM (N/A) as a surgical intervention .  [name] history has been reviewed, patient examined, no change in status, stable for surgery.  I have reviewed the patient's chart and labs.  Questions were answered to the patient's satisfaction.     TOTH III,Cynthie Garmon S

## 2016-12-07 ENCOUNTER — Encounter (HOSPITAL_COMMUNITY): Payer: Self-pay | Admitting: General Surgery

## 2017-03-11 ENCOUNTER — Other Ambulatory Visit: Payer: Self-pay | Admitting: Internal Medicine

## 2017-03-30 NOTE — Addendum Note (Signed)
Addendum  created 03/30/17 1005 by Lyn Hollingshead, MD   Sign clinical note

## 2017-05-30 ENCOUNTER — Encounter: Payer: Self-pay | Admitting: Internal Medicine

## 2017-05-30 ENCOUNTER — Ambulatory Visit (INDEPENDENT_AMBULATORY_CARE_PROVIDER_SITE_OTHER): Payer: 59 | Admitting: Internal Medicine

## 2017-05-30 VITALS — BP 132/76 | HR 64 | Temp 97.9°F | Resp 14 | Ht 72.0 in | Wt 202.1 lb

## 2017-05-30 DIAGNOSIS — L729 Follicular cyst of the skin and subcutaneous tissue, unspecified: Secondary | ICD-10-CM

## 2017-05-30 DIAGNOSIS — F419 Anxiety disorder, unspecified: Secondary | ICD-10-CM

## 2017-05-30 DIAGNOSIS — L989 Disorder of the skin and subcutaneous tissue, unspecified: Secondary | ICD-10-CM

## 2017-05-30 MED ORDER — SERTRALINE HCL 100 MG PO TABS: 100.0000 mg | ORAL_TABLET | Freq: Every day | ORAL | 3 refills | Status: DC

## 2017-05-30 NOTE — Progress Notes (Signed)
Subjective:    Patient ID: David Lowery, male    DOB: 1971-09-23, 46 y.o.   MRN: [id number]  DOS:  05/30/2017 Type of visit - description : f/u Interval history: Anxiety: Well-controlled with sertraline, no apparent side effects. Have gallbladder surgery, since then is doing well. Has a skin lesion at the nose, is giving  him problems again. Has a cyst @ the  back, has increased in size and also he has developed a new one.   Review of Systems Occasionally has mild dyspepsia after gallbladder surgery but no diarrhea or blood in the stools. No chest pain or difficulty breathing  Past Medical History:  Diagnosis Date  . Anxiety   . Asthma due to seasonal allergies   . OSA (obstructive sleep apnea)    initial sx: palpitations-fatigue--s/p surgery, much improved     Past Surgical History:  Procedure Laterality Date  . CHOLECYSTECTOMY N/A 12/06/2016   Procedure: LAPAROSCOPIC CHOLECYSTECTOMY WITH INTRAOPERATIVE CHOLANGIOGRAM;  Surgeon: Autumn Messing III, MD;  Location: [place];  Service: General;  Laterality: N/A;  . THROAT SURGERY  2011   tonsilectomy, uvulectomy d/t OSA     Social History   Social History  . Marital status: Married    Spouse name: N/A  . Number of children: 1  . Years of education: N/A   Occupational History  . Astronomer    [employer]   Social History Main Topics  . Smoking status: Never Smoker  . Smokeless tobacco: Never Used  . Alcohol use No     Comment: socially   . Drug use: No  . Sexual activity: Not on file   Other Topics Concern  . Not on file   Social History Narrative   Moved to [facility] July 2014, from [place]   Son born ~ 2012      Allergies as of 05/30/2017      Reactions   Sudafed [pseudoephedrine] Anxiety, Palpitations      Medication List       Accurate as of 05/30/17  1:05 PM. Always use your most recent med list.          fexofenadine 180 MG tablet Commonly known as:  ALLEGRA Take  180 mg by mouth daily as needed for allergies or rhinitis.   HYDROcodone-acetaminophen 5-325 MG tablet Commonly known as:  NORCO/VICODIN Take 1-2 tablets by mouth every 4 (four) hours as needed for moderate pain or severe pain.   sertraline 100 MG tablet Commonly known as:  ZOLOFT Take 1 tablet (100 mg total) by mouth daily.          Objective:   Physical Exam  HENT:  Head:    Skin:      BP 132/76 (BP Location: Left Arm, Patient Position: Sitting, Cuff Size: Normal)   Pulse 64   Temp 97.9 F (36.6 C) (Oral)   Resp 14   Ht 6' (1.829 m)   Wt 202 lb 2 oz (91.7 kg)   SpO2 98%   BMI 27.41 kg/m  General:   Well developed, well nourished . NAD.  HEENT:  Normocephalic . Face symmetric, atraumatic  Neurologic:  alert & oriented X3.  Speech normal, gait appropriate for age and unassisted Psych--  Cognition and judgment appear intact.  Cooperative with normal attention span and concentration.  Behavior appropriate. No anxious or depressed appearing.      Assessment & Plan:  Assessment OSA -initial sx: palpitations-fatigue--s/p surgery, much improved  Anxiety Allergies Hernia L , saw surgery 2015, offered surgery Actinic keratosis, nevus -- saw derm, excisions 2015   Plan Anxiety: Well-controlled with sertraline, refill. Cyst, upper back: He actually has 2 cysts on the back, the one @ right side has increased in size and is very noticeable on simple exam. Refer to surgery for consideration of excision. Skin lesion, nose: In 2015 was referred to dermatology, nose lesion was freeze, area become irritated on and off since the procedure with occasional bleeding. Refer back to dermatology. RTC 6 months CPX

## 2017-05-30 NOTE — Patient Instructions (Signed)
  GO TO THE FRONT DESK Schedule your next appointment for a  Physical exam in 6 months, fasting     

## 2017-05-30 NOTE — Progress Notes (Signed)
Pre visit review using our clinic review tool, if applicable. No additional management support is needed unless otherwise documented below in the visit note. 

## 2017-05-31 DIAGNOSIS — F419 Anxiety disorder, unspecified: Secondary | ICD-10-CM | POA: Insufficient documentation

## 2017-05-31 NOTE — Assessment & Plan Note (Signed)
Anxiety: Well-controlled with sertraline, refill. Cyst, upper back: He actually has 2 cysts on the back, the one @ right side has increased in size and is very noticeable on simple exam. Refer to surgery for consideration of excision. Skin lesion, nose: In 2015 was referred to dermatology, nose lesion was freeze, area become irritated on and off since the procedure with occasional bleeding. Refer back to dermatology. RTC 6 months CPX

## 2017-07-18 ENCOUNTER — Ambulatory Visit: Payer: Self-pay | Admitting: General Surgery

## 2017-08-28 ENCOUNTER — Encounter (HOSPITAL_BASED_OUTPATIENT_CLINIC_OR_DEPARTMENT_OTHER): Payer: Self-pay | Admitting: *Deleted

## 2017-08-30 ENCOUNTER — Encounter (HOSPITAL_BASED_OUTPATIENT_CLINIC_OR_DEPARTMENT_OTHER): Payer: Self-pay | Admitting: Anesthesiology

## 2017-08-31 ENCOUNTER — Encounter (HOSPITAL_BASED_OUTPATIENT_CLINIC_OR_DEPARTMENT_OTHER): Payer: Self-pay

## 2017-08-31 ENCOUNTER — Encounter (HOSPITAL_BASED_OUTPATIENT_CLINIC_OR_DEPARTMENT_OTHER)
Admission: RE | Disposition: A | Discharge status: Home / Self Care | Payer: Self-pay | Source: Ambulatory Visit | Attending: General Surgery

## 2017-08-31 ENCOUNTER — Ambulatory Visit (HOSPITAL_BASED_OUTPATIENT_CLINIC_OR_DEPARTMENT_OTHER)
Admission: RE | Admit: 2017-08-31 | Discharge: 2017-08-31 | Disposition: A | Discharge status: Home / Self Care | Payer: 59 | Source: Ambulatory Visit | Attending: General Surgery | Admitting: General Surgery

## 2017-08-31 DIAGNOSIS — L720 Epidermal cyst: Secondary | ICD-10-CM | POA: Diagnosis not present

## 2017-08-31 DIAGNOSIS — L723 Sebaceous cyst: Secondary | ICD-10-CM | POA: Diagnosis present

## 2017-08-31 HISTORY — PX: MASS EXCISION: SHX2000

## 2017-08-31 SURGERY — MINOR EXCISION OF MASS
Anesthesia: LOCAL

## 2017-08-31 MED ORDER — CHLORHEXIDINE GLUCONATE CLOTH 2 % EX PADS: 6.0000 | MEDICATED_PAD | Freq: Once | CUTANEOUS | Status: DC

## 2017-08-31 MED ORDER — HYDROCODONE-ACETAMINOPHEN 5-325 MG PO TABS: 1.0000 | ORAL_TABLET | Freq: Four times a day (QID) | ORAL | 0 refills | Status: DC | PRN

## 2017-08-31 MED ORDER — BUPIVACAINE-EPINEPHRINE 0.25% -1:200000 IJ SOLN
INTRAMUSCULAR | Status: AC
  Filled 2017-08-31: qty 1

## 2017-08-31 MED ORDER — GABAPENTIN 300 MG PO CAPS: 300.0000 mg | ORAL_CAPSULE | ORAL | Status: DC

## 2017-08-31 MED ORDER — LIDOCAINE HCL (PF) 1 % IJ SOLN
INTRAMUSCULAR | Status: DC | PRN
  Administered 2017-08-31 (×2): 5 mL

## 2017-08-31 MED ORDER — BUPIVACAINE-EPINEPHRINE (PF) 0.25% -1:200000 IJ SOLN
INTRAMUSCULAR | Status: DC | PRN
  Administered 2017-08-31 (×2): 5 mL via PERINEURAL

## 2017-08-31 MED ORDER — LIDOCAINE HCL (PF) 1 % IJ SOLN
INTRAMUSCULAR | Status: AC
  Filled 2017-08-31: qty 30

## 2017-08-31 MED ORDER — BUPIVACAINE HCL (PF) 0.25 % IJ SOLN
INTRAMUSCULAR | Status: AC
  Filled 2017-08-31: qty 30

## 2017-08-31 MED ORDER — CELECOXIB 200 MG PO CAPS: 200.0000 mg | ORAL_CAPSULE | ORAL | Status: DC

## 2017-08-31 MED ORDER — ACETAMINOPHEN 500 MG PO TABS: 1000.0000 mg | ORAL_TABLET | ORAL | Status: DC

## 2017-08-31 MED ORDER — BUPIVACAINE-EPINEPHRINE (PF) 0.25% -1:200000 IJ SOLN
INTRAMUSCULAR | Status: AC
  Filled 2017-08-31: qty 30

## 2017-08-31 SURGICAL SUPPLY — 33 items
BLADE SURG 15 STRL LF DISP TIS (BLADE) ×1 IMPLANT
BLADE SURG 15 STRL SS (BLADE) ×1
CANISTER SUCT 1200ML W/VALVE (MISCELLANEOUS) IMPLANT
CHLORAPREP W/TINT 26ML (MISCELLANEOUS) ×2 IMPLANT
COVER MAYO STAND STRL (DRAPES) ×2 IMPLANT
DECANTER SPIKE VIAL GLASS SM (MISCELLANEOUS) IMPLANT
DERMABOND ADVANCED (GAUZE/BANDAGES/DRESSINGS) ×1
DERMABOND ADVANCED .7 DNX12 (GAUZE/BANDAGES/DRESSINGS) ×1 IMPLANT
DRAPE UTILITY XL STRL (DRAPES) ×2 IMPLANT
ELECT COATED BLADE 2.86 ST (ELECTRODE) ×2 IMPLANT
ELECT REM PT RETURN 9FT ADLT (ELECTROSURGICAL) ×2
ELECTRODE REM PT RTRN 9FT ADLT (ELECTROSURGICAL) ×1 IMPLANT
GAUZE SPONGE 4X4 16PLY XRAY LF (GAUZE/BANDAGES/DRESSINGS) ×2 IMPLANT
GLOVE BIO SURGEON STRL SZ7.5 (GLOVE) ×2 IMPLANT
GLOVE BIOGEL PI IND STRL 7.0 (GLOVE) ×2 IMPLANT
GLOVE BIOGEL PI INDICATOR 7.0 (GLOVE) ×2
GLOVE ECLIPSE 6.5 STRL STRAW (GLOVE) ×2 IMPLANT
GOWN STRL REUS W/ TWL LRG LVL3 (GOWN DISPOSABLE) ×2 IMPLANT
GOWN STRL REUS W/TWL LRG LVL3 (GOWN DISPOSABLE) ×2
MARKER SKIN DUAL TIP RULER LAB (MISCELLANEOUS) ×2 IMPLANT
NDL SAFETY ECLIPSE 18X1.5 (NEEDLE) IMPLANT
NEEDLE HYPO 18GX1.5 SHARP (NEEDLE)
NEEDLE HYPO 25X1 1.5 SAFETY (NEEDLE) ×2 IMPLANT
PACK BASIN DAY SURGERY FS (CUSTOM PROCEDURE TRAY) ×2 IMPLANT
PENCIL BUTTON HOLSTER BLD 10FT (ELECTRODE) ×2 IMPLANT
SHEET MEDIUM DRAPE 40X70 STRL (DRAPES) IMPLANT
SUT MON AB 4-0 PC3 18 (SUTURE) ×4 IMPLANT
SUT VIC AB 3-0 SH 27 (SUTURE)
SUT VIC AB 3-0 SH 27X BRD (SUTURE) IMPLANT
SYR CONTROL 10ML LL (SYRINGE) ×2 IMPLANT
TOWEL OR NON WOVEN STRL DISP B (DISPOSABLE) IMPLANT
TUBE CONNECTING 20X1/4 (TUBING) IMPLANT
YANKAUER SUCT BULB TIP NO VENT (SUCTIONS) IMPLANT

## 2017-08-31 NOTE — Interval H&P Note (Signed)
History and Physical Interval Note:  08/31/2017 7:31 AM  David Lowery  has presented today for surgery, with the diagnosis of sebaceous cyst back  The various methods of treatment have been discussed with the patient and family. After consideration of risks, benefits and other options for treatment, the patient has consented to  Procedure(s): MINOR EXCISION SEBACEOUS CYST ON BACK X 2 (N/A) as a surgical intervention .  [name] history has been reviewed, patient examined, no change in status, stable for surgery.  I have reviewed the patient's chart and labs.  Questions were answered to the patient's satisfaction.     TOTH III,Athira Janowicz S

## 2017-08-31 NOTE — Op Note (Signed)
08/31/2017  8:17 AM  PATIENT:  David Lowery  46 y.o. male  PRE-OPERATIVE DIAGNOSIS:  sebaceous cyst back X 2  POST-OPERATIVE DIAGNOSIS:  sebaceous cyst back X 2  PROCEDURE:  Procedure(s): MINOR EXCISION SEBACEOUS CYST ON BACK X 2 (N/A)  SURGEON:  Surgeon(s) and Role:    * Jovita Kussmaul, MD - Primary  PHYSICIAN ASSISTANT:   ASSISTANTS: none   ANESTHESIA:   local  EBL:  minimal   BLOOD ADMINISTERED:none  DRAINS: none   LOCAL MEDICATIONS USED:  MARCAINE     SPECIMEN:  Source of Specimen:  cyst on back X 2  DISPOSITION OF SPECIMEN:  PATHOLOGY  COUNTS:  YES  TOURNIQUET:  * No tourniquets in log *  DICTATION: .Dragon Dictation   After informed consent was obtained the patient was brought to the operating room and placed in the prone position on the operating room table. Once the patient was comfortable the upper back was prepped with ChloraPrep, allowed to dry, and draped in usual sterile manner. An appropriate timeout was performed. The area around both cysts was infiltrated with a combination of 1% lidocaine and quarter percent Marcaine with epinephrine until a good field block was created. Each cyst was incised in elliptical fashion through the skin and subcutaneous fat until the entire cyst was removed. Hemostasis was achieved using the Bovie electrocautery. Each of the incisions was then closed with interrupted 4-0 Monocryl subcuticular stitches. Dermabond dressings were applied. The patient tolerated the procedure well. At the end of the case all needle sponge counts were correct. The patient was then taken to recovery in stable condition. The smaller mass measured 1 cm in diameter. The larger mass measured 3 cm in diameter.  PLAN OF CARE: Discharge to home after PACU  PATIENT DISPOSITION:  PACU - hemodynamically stable.   Delay start of Pharmacological VTE agent (>24hrs) due to surgical blood loss or risk of bleeding: not applicable

## 2017-08-31 NOTE — H&P (Signed)
David Lowery  Location: [facility] Surgery Patient #: [id number] DOB: September 29, 1971 Married / Language: English / Race: White Male   History of Present Illness The patient is a 46 year old male who presents for a follow-up for Mass. The patient is a 46 year old white male who is a previous patient of mine. He previously had a laparoscopic cholecystectomy and continues to do well from this with no abdominal pain. Over the last few years he has also had a cyst on his right posterior shoulder area that has gradually gotten larger. It is now causing him some discomfort. He would like to have it removed. He denies any fevers or chills. He's had no drainage from the area.   Allergies  Sudafed Cough *COUGH/COLD/ALLERGY*  Allergies Reconciled   Medication History  Sertraline HCl (100MG  Tablet, Oral) Active. Allegra (30MG  Tablet, Oral) Active. Medications Reconciled    Review of Systems  General Not Present- Appetite Loss, Chills, Fatigue, Fever, Night Sweats, Weight Gain and Weight Loss. Skin Not Present- Change in Wart/Mole, Dryness, Hives, Jaundice, New Lesions, Non-Healing Wounds, Rash and Ulcer. HEENT Present- Seasonal Allergies and Sore Throat. Not Present- Earache, Hearing Loss, Hoarseness, Nose Bleed, Oral Ulcers, Ringing in the Ears, Sinus Pain, Visual Disturbances, Wears glasses/contact lenses and Yellow Eyes. Respiratory Not Present- Bloody sputum, Chronic Cough, Difficulty Breathing, Snoring and Wheezing. Breast Not Present- Breast Mass, Breast Pain, Nipple Discharge and Skin Changes. Cardiovascular Not Present- Chest Pain, Difficulty Breathing Lying Down, Leg Cramps, Palpitations, Rapid Heart Rate, Shortness of Breath and Swelling of Extremities. Gastrointestinal Present- Abdominal Pain and Indigestion. Not Present- Bloating, Bloody Stool, Change in Bowel Habits, Chronic diarrhea, Constipation, Difficulty Swallowing, Excessive gas, Gets full quickly at meals,  Hemorrhoids, Nausea, Rectal Pain and Vomiting. Male Genitourinary Not Present- Blood in Urine, Change in Urinary Stream, Frequency, Impotence, Nocturia, Painful Urination, Urgency and Urine Leakage.  Vitals  Weight: 202.6 lb Height: 72in Body Surface Area: 2.14 m Body Mass Index: 27.48 kg/m  Temp.: 98.78F  Pulse: 64 (Regular)  P.OX: 98% (Room air) BP: 122/78 (Sitting, Left Arm, Standard)       Physical Exam  General Mental Status-Alert. General Appearance-Consistent with stated age. Hydration-Well hydrated. Voice-Normal.  Head and Neck Head-normocephalic, atraumatic with no lesions or palpable masses. Trachea-midline. Thyroid Gland Characteristics - normal size and consistency.  Eye Eyeball - Bilateral-Extraocular movements intact. Sclera/Conjunctiva - Bilateral-No scleral icterus.  Chest and Lung Exam Chest and lung exam reveals -quiet, even and easy respiratory effort with no use of accessory muscles and on auscultation, normal breath sounds, no adventitious sounds and normal vocal resonance. Inspection Chest Wall - Normal. Back - normal.  Cardiovascular Cardiovascular examination reveals -normal heart sounds, regular rate and rhythm with no murmurs and normal pedal pulses bilaterally.  Abdomen Inspection Inspection of the abdomen reveals - No Hernias. Skin - Scar - no surgical scars. Palpation/Percussion Palpation and Percussion of the abdomen reveal - Soft, Non Tender, No Rebound tenderness, No Rigidity (guarding) and No hepatosplenomegaly. Auscultation Auscultation of the abdomen reveals - Bowel sounds normal.  Neurologic Neurologic evaluation reveals -alert and oriented x 3 with no impairment of recent or remote memory. Mental Status-Normal.  Musculoskeletal Normal Exam - Left-Upper Extremity Strength Normal and Lower Extremity Strength Normal. Normal Exam - Right-Upper Extremity Strength Normal and Lower Extremity  Strength Normal. Note: There is a 3 cm mobile mass consistent with a sebaceous cyst on the right trapezius area. There is also a smaller 1 cm mass along the upper midline of the back  with a very similar appearance. There is no associated cellulitis. Both areas are mobile and appear to be isolated to the skin and subcutaneous tissue.   Lymphatic Head & Neck  General Head & Neck Lymphatics: Bilateral - Description - Normal. Axillary  General Axillary Region: Bilateral - Description - Normal. Tenderness - Non Tender. Femoral & Inguinal  Generalized Femoral & Inguinal Lymphatics: Bilateral - Description - Normal. Tenderness - Non Tender.    Assessment & Plan SEBACEOUS CYST (L72.3) Impression: The patient appears to have a moderate size sebaceous cyst on his right trapezius area and a smaller one on the midline of the upper back. Both of these are causing him some discomfort and because of this I think would be reasonable to remove them. I have discussed with him in detail the risks and benefits of the operation as well as some of the technical aspects and he understands and wishes to proceed Current Plans Pt Education - Skin Conditions: discussed with patient and provided information.

## 2017-09-03 ENCOUNTER — Encounter (HOSPITAL_BASED_OUTPATIENT_CLINIC_OR_DEPARTMENT_OTHER): Payer: Self-pay | Admitting: General Surgery

## 2017-09-11 ENCOUNTER — Encounter (HOSPITAL_BASED_OUTPATIENT_CLINIC_OR_DEPARTMENT_OTHER): Payer: Self-pay | Admitting: General Surgery

## 2017-09-12 ENCOUNTER — Telehealth: Payer: Self-pay | Admitting: *Deleted

## 2017-09-12 NOTE — Telephone Encounter (Signed)
Received Dermatopathology Report results from [place]; forwarded to provider/SLS 11/21

## 2018-03-05 ENCOUNTER — Ambulatory Visit (INDEPENDENT_AMBULATORY_CARE_PROVIDER_SITE_OTHER): Payer: BLUE CROSS/BLUE SHIELD | Admitting: Internal Medicine

## 2018-03-05 ENCOUNTER — Encounter: Payer: Self-pay | Admitting: Internal Medicine

## 2018-03-05 VITALS — BP 122/70 | HR 69 | Temp 98.1°F | Resp 14 | Ht 72.0 in | Wt 204.2 lb

## 2018-03-05 DIAGNOSIS — Z0000 Encounter for general adult medical examination without abnormal findings: Secondary | ICD-10-CM

## 2018-03-05 DIAGNOSIS — J302 Other seasonal allergic rhinitis: Secondary | ICD-10-CM | POA: Diagnosis not present

## 2018-03-05 LAB — CBC WITH DIFFERENTIAL/PLATELET
Basophils Absolute: 0.1 10*3/uL (ref 0.0–0.1)
Basophils Relative: 0.7 % (ref 0.0–3.0)
EOS PCT: 7.5 % — AB (ref 0.0–5.0)
Eosinophils Absolute: 0.5 10*3/uL (ref 0.0–0.7)
HCT: 44.2 % (ref 39.0–52.0)
HEMOGLOBIN: 15.4 g/dL (ref 13.0–17.0)
LYMPHS ABS: 2.1 10*3/uL (ref 0.7–4.0)
Lymphocytes Relative: 29.4 % (ref 12.0–46.0)
MCHC: 34.8 g/dL (ref 30.0–36.0)
MCV: 91.1 fl (ref 78.0–100.0)
MONO ABS: 0.4 10*3/uL (ref 0.1–1.0)
Monocytes Relative: 4.8 % (ref 3.0–12.0)
Neutro Abs: 4.2 10*3/uL (ref 1.4–7.7)
Neutrophils Relative %: 57.6 % (ref 43.0–77.0)
Platelets: 244 10*3/uL (ref 150.0–400.0)
RBC: 4.86 Mil/uL (ref 4.22–5.81)
RDW: 12.4 % (ref 11.5–15.5)
WBC: 7.2 10*3/uL (ref 4.0–10.5)

## 2018-03-05 LAB — LIPID PANEL
Cholesterol: 187 mg/dL (ref 0–200)
HDL: 37.7 mg/dL — AB (ref >39.00)
NonHDL: 148.83
TRIGLYCERIDES: 253 mg/dL — AB (ref 0.0–149.0)
Total CHOL/HDL Ratio: 5
VLDL: 50.6 mg/dL — ABNORMAL HIGH (ref 0.0–40.0)

## 2018-03-05 LAB — COMPREHENSIVE METABOLIC PANEL
ALT: 35 U/L (ref 0–53)
AST: 23 U/L (ref 0–37)
Albumin: 4.4 g/dL (ref 3.5–5.2)
Alkaline Phosphatase: 70 U/L (ref 39–117)
BILIRUBIN TOTAL: 1 mg/dL (ref 0.2–1.2)
BUN: 11 mg/dL (ref 6–23)
CALCIUM: 9.3 mg/dL (ref 8.4–10.5)
CO2: 28 mEq/L (ref 19–32)
Chloride: 103 mEq/L (ref 96–112)
Creatinine, Ser: 0.71 mg/dL (ref 0.40–1.50)
GFR: 127.53 mL/min (ref >60.00)
Glucose, Bld: 95 mg/dL (ref 70–99)
Potassium: 4 mEq/L (ref 3.5–5.1)
Sodium: 139 mEq/L (ref 135–145)
TOTAL PROTEIN: 7 g/dL (ref 6.0–8.3)

## 2018-03-05 LAB — TSH: TSH: 0.73 u[IU]/mL (ref 0.35–4.50)

## 2018-03-05 LAB — LDL CHOLESTEROL, DIRECT: Direct LDL: 98 mg/dL

## 2018-03-05 LAB — HEMOGLOBIN A1C: HEMOGLOBIN A1C: 5.5 % (ref 4.6–6.5)

## 2018-03-05 MED ORDER — SERTRALINE HCL 100 MG PO TABS: 100.0000 mg | ORAL_TABLET | Freq: Every day | ORAL | 3 refills | Status: DC

## 2018-03-05 MED ORDER — ALBUTEROL SULFATE HFA 108 (90 BASE) MCG/ACT IN AERS
2.0000 | INHALATION_SPRAY | Freq: Four times a day (QID) | RESPIRATORY_TRACT | 1 refills | Status: DC | PRN
Start: 2018-03-05 — End: 2024-06-30

## 2018-03-05 NOTE — Patient Instructions (Signed)
GO TO THE LAB : Get the blood work     GO TO THE FRONT DESK Schedule your next appointment for a  Physical exam in 1 year 

## 2018-03-05 NOTE — Progress Notes (Signed)
Pre visit review using our clinic review tool, if applicable. No additional management support is needed unless otherwise documented below in the visit note. 

## 2018-03-05 NOTE — Assessment & Plan Note (Signed)
Td 2017 CCS:  cscope-- never Labs: CMP, FLP, CBC, A1c, TSH Diet and exercise: Counseled, he is doing well.

## 2018-03-05 NOTE — Progress Notes (Signed)
Subjective:    Patient ID: David Lowery, male    DOB: 1970-11-28, 47 y.o.   MRN: [id number]  DOS:  03/05/2018 Type of visit - description : cpx Interval history: No major concern other than allergies, currently  alternating Allegra or Xyzal.  Also has a leftover albuterol that he use from time to time for wheezing.  Request refill. Sertraline is working well for him, request a refill   Review of Systems  Other than above, a 14 point review of systems is negative    Past Medical History:  Diagnosis Date  . Anxiety   . Asthma due to seasonal allergies   . OSA (obstructive sleep apnea)    initial sx: palpitations-fatigue--s/p surgery, much improved     Past Surgical History:  Procedure Laterality Date  . CHOLECYSTECTOMY N/A 12/06/2016   Procedure: LAPAROSCOPIC CHOLECYSTECTOMY WITH INTRAOPERATIVE CHOLANGIOGRAM;  Surgeon: Autumn Messing III, MD;  Location: [facility];  Service: General;  Laterality: N/A;  . MASS EXCISION N/A 08/31/2017   Procedure: MINOR EXCISION SEBACEOUS CYST ON BACK X 2;  Surgeon: Jovita Kussmaul, MD;  Location: [facility];  Service: General;  Laterality: N/A;  . THROAT SURGERY  2011   tonsilectomy, uvulectomy d/t OSA     Social History   Socioeconomic History  . Marital status: Married    Spouse name: Not on file  . Number of children: 1  . Years of education: Not on file  . Highest education level: Not on file  Occupational History  . Occupation: Pharmacologist: Geneticist, molecular    Comment: [place]  . Financial resource strain: Not on file  . Food insecurity:    Worry: Not on file    Inability: Not on file  . Transportation needs:    Medical: Not on file    Non-medical: Not on file  Tobacco Use  . Smoking status: Never Smoker  . Smokeless tobacco: Never Used  Substance and Sexual Activity  . Alcohol use: No    Comment: socially   . Drug use: No  . Sexual activity: Not on file  Lifestyle  . Physical  activity:    Days per week: Not on file    Minutes per session: Not on file  . Stress: Not on file  Relationships  . Social connections:    Talks on phone: Not on file    Gets together: Not on file    Attends religious service: Not on file    Active member of club or organization: Not on file    Attends meetings of clubs or organizations: Not on file    Relationship status: Not on file  . Intimate partner violence:    Fear of current or ex partner: Not on file    Emotionally abused: Not on file    Physically abused: Not on file    Forced sexual activity: Not on file  Other Topics Concern  . Not on file  Social History Narrative   Moved to [facility] July 2014, from [employer]   Son born ~ 2012     Family History  Problem Relation Age of Onset  . Hyperlipidemia Brother        bro, mother, GM  . Colon cancer Other        GM at age 17  . Diabetes Other        aunt  . CAD Neg Hx   .  Stroke Neg Hx   . Prostate cancer Neg Hx     Allergies as of 03/05/2018      Reactions   Sudafed [pseudoephedrine] Anxiety, Palpitations      Medication List        Accurate as of 03/05/18 11:59 PM. Always use your most recent med list.          albuterol 108 (90 Base) MCG/ACT inhaler Commonly known as:  VENTOLIN HFA Inhale 2 puffs into the lungs every 6 (six) hours as needed for wheezing or shortness of breath.   fexofenadine 180 MG tablet Commonly known as:  ALLEGRA Take 180 mg by mouth daily as needed for allergies or rhinitis.   mometasone 50 MCG/ACT nasal spray Commonly known as:  NASONEX Place 2 sprays into the nose daily as needed.   sertraline 100 MG tablet Commonly known as:  ZOLOFT Take 1 tablet (100 mg total) by mouth daily.          Objective:   Physical Exam BP 122/70 (BP Location: Left Arm, Patient Position: Sitting, Cuff Size: Small)   Pulse 69   Temp 98.1 F (36.7 C) (Oral)   Resp 14   Ht 6' (1.829 m)   Wt 204 lb 3 oz (92.6 kg)   SpO2 97%   BMI  27.69 kg/m  General:   Well developed, well nourished . NAD.  Neck: No  thyromegaly  HEENT:  Normocephalic . Face symmetric, atraumatic Lungs:  CTA B Normal respiratory effort, no intercostal retractions, no accessory muscle use. Heart: RRR,  no murmur.  No pretibial edema bilaterally  Abdomen:  Not distended, soft, non-tender. No rebound or rigidity.   Skin: Exposed areas without rash. Not pale. Not jaundice Neurologic:  alert & oriented X3.  Speech normal, gait appropriate for age and unassisted Strength symmetric and appropriate for age.  Psych: Cognition and judgment appear intact.  Cooperative with normal attention span and concentration.  Behavior appropriate. No anxious or depressed appearing.     Assessment & Plan:   Assessment OSA -initial sx: palpitations-fatigue--s/p surgery, much improved  Anxiety Allergies, bronchospasm Hernia L , saw surgery 2015, offered surgery Actinic keratosis, nevus -- saw derm, excisions 2015   Plan Anxiety : Refill sertraline doing well. Allergies, bronchospasm: Continue Allegra, Xyzal, Nasonex.  He uses albuterol rarely, prescription sent, if wheezing is more persistent he will let me know. At some point may like to see a allergist, he will call for a referral Cyst, upper back: See last visit, s/p excision . Skin lesion, nose: See last visit, saw dermatology, Rx observation RTC 1 year

## 2018-03-06 NOTE — Assessment & Plan Note (Signed)
Anxiety : Refill sertraline doing well. Allergies, bronchospasm: Continue Allegra, Xyzal, Nasonex.  He uses albuterol rarely, prescription sent, if wheezing is more persistent he will let me know. At some point may like to see a allergist, he will call for a referral Cyst, upper back: See last visit, s/p excision . Skin lesion, nose: See last visit, saw dermatology, Rx observation RTC 1 year

## 2018-06-28 ENCOUNTER — Encounter: Payer: Self-pay | Admitting: Internal Medicine

## 2018-06-28 ENCOUNTER — Ambulatory Visit: Payer: BLUE CROSS/BLUE SHIELD | Admitting: Internal Medicine

## 2018-06-28 VITALS — BP 118/68 | HR 59 | Temp 97.8°F | Resp 14 | Ht 72.0 in | Wt 204.5 lb

## 2018-06-28 DIAGNOSIS — R1013 Epigastric pain: Secondary | ICD-10-CM | POA: Diagnosis not present

## 2018-06-28 NOTE — Patient Instructions (Signed)
Call if the problem resurface

## 2018-06-28 NOTE — Progress Notes (Signed)
Subjective:    Patient ID: David Lowery, male    DOB: 13-Aug-1971, 47 y.o.   MRN: [id number]  DOS:  06/28/2018 Type of visit - description : acute Interval history:  Patient was vacationing in [place],  around 06/18/2018 he developed a painful burning at the mid abdomen shortly after dinner.  It lasted 45 minutes. Immediately after he developed "sulfur burps" they lasted a day and a half. Since then he is back to baseline but he remains somewhat concerned  Review of Systems No nausea or vomiting.  No diarrhea Bowel movements are at baseline, since he had a gallbladder surgery he has BMs right after eating. Appetite is good. Hardly ever has heartburn.  Past Medical History:  Diagnosis Date  . Anxiety   . Asthma due to seasonal allergies   . OSA (obstructive sleep apnea)    initial sx: palpitations-fatigue--s/p surgery, much improved     Past Surgical History:  Procedure Laterality Date  . CHOLECYSTECTOMY N/A 12/06/2016   Procedure: LAPAROSCOPIC CHOLECYSTECTOMY WITH INTRAOPERATIVE CHOLANGIOGRAM;  Surgeon: Autumn Messing III, MD;  Location: [facility];  Service: General;  Laterality: N/A;  . MASS EXCISION N/A 08/31/2017   Procedure: MINOR EXCISION SEBACEOUS CYST ON BACK X 2;  Surgeon: Jovita Kussmaul, MD;  Location: [facility];  Service: General;  Laterality: N/A;  . THROAT SURGERY  2011   tonsilectomy, uvulectomy d/t OSA     Social History   Socioeconomic History  . Marital status: Married    Spouse name: Not on file  . Number of children: 1  . Years of education: Not on file  . Highest education level: Not on file  Occupational History  . Occupation: Pharmacologist: Geneticist, molecular    Comment: [place]  . Financial resource strain: Not on file  . Food insecurity:    Worry: Not on file    Inability: Not on file  . Transportation needs:    Medical: Not on file    Non-medical: Not on file  Tobacco Use  . Smoking status: Never  Smoker  . Smokeless tobacco: Never Used  Substance and Sexual Activity  . Alcohol use: No    Comment: socially   . Drug use: No  . Sexual activity: Not on file  Lifestyle  . Physical activity:    Days per week: Not on file    Minutes per session: Not on file  . Stress: Not on file  Relationships  . Social connections:    Talks on phone: Not on file    Gets together: Not on file    Attends religious service: Not on file    Active member of club or organization: Not on file    Attends meetings of clubs or organizations: Not on file    Relationship status: Not on file  . Intimate partner violence:    Fear of current or ex partner: Not on file    Emotionally abused: Not on file    Physically abused: Not on file    Forced sexual activity: Not on file  Other Topics Concern  . Not on file  Social History Narrative   Moved to [facility] July 2014, from [employer]   Son born ~ 2012      Allergies as of 06/28/2018      Reactions   Sudafed [pseudoephedrine] Anxiety, Palpitations      Medication List  Accurate as of 06/28/18 11:59 PM. Always use your most recent med list.          albuterol 108 (90 Base) MCG/ACT inhaler Commonly known as:  PROVENTIL HFA;VENTOLIN HFA Inhale 2 puffs into the lungs every 6 (six) hours as needed for wheezing or shortness of breath.   fexofenadine 180 MG tablet Commonly known as:  ALLEGRA Take 180 mg by mouth daily as needed for allergies or rhinitis.   mometasone 50 MCG/ACT nasal spray Commonly known as:  NASONEX Place 2 sprays into the nose daily as needed.   sertraline 100 MG tablet Commonly known as:  ZOLOFT Take 1 tablet (100 mg total) by mouth daily.          Objective:   Physical Exam BP 118/68 (BP Location: Left Arm, Patient Position: Sitting, Cuff Size: Small)   Pulse (!) 59   Temp 97.8 F (36.6 C) (Oral)   Resp 14   Ht 6' (1.829 m)   Wt 204 lb 8 oz (92.8 kg)   SpO2 98%   BMI 27.74 kg/m  General:   Well  developed, NAD, see BMI.  HEENT:  Normocephalic . Face symmetric, atraumatic Lungs:  CTA B Normal respiratory effort, no intercostal retractions, no accessory muscle use. Heart: RRR,  no murmur.  no pretibial edema bilaterally  Abdomen:  Not distended, soft, non-tender. No rebound or rigidity.   Skin: Not pale. Not jaundice Neurologic:  alert & oriented X3.  Speech normal, gait appropriate for age and unassisted Psych--  Cognition and judgment appear intact.  Cooperative with normal attention span and concentration.  Behavior appropriate. No anxious or depressed appearing.     Assessment & Plan:   Assessment OSA -initial sx: palpitations-fatigue--s/p surgery, much improved  Anxiety Allergies, bronchospasm Hernia L , saw surgery 2015, offered surgery Actinic keratosis, nevus -- saw derm, excisions 2015   Plan Dyspepsia: Likely related to food, no red flag symptoms, he is back to normal.  Recommend observation for now, will call if symptoms resurface.

## 2018-06-28 NOTE — Progress Notes (Signed)
Pre visit review using our clinic review tool, if applicable. No additional management support is needed unless otherwise documented below in the visit note. 

## 2018-06-29 NOTE — Assessment & Plan Note (Signed)
Dyspepsia: Likely related to food, no red flag symptoms, he is back to normal.  Recommend observation for now, will call if symptoms resurface.

## 2018-10-08 DIAGNOSIS — J0100 Acute maxillary sinusitis, unspecified: Secondary | ICD-10-CM | POA: Diagnosis not present

## 2018-11-07 DIAGNOSIS — J3089 Other allergic rhinitis: Secondary | ICD-10-CM | POA: Diagnosis not present

## 2018-11-07 DIAGNOSIS — J301 Allergic rhinitis due to pollen: Secondary | ICD-10-CM | POA: Diagnosis not present

## 2018-11-07 DIAGNOSIS — J3081 Allergic rhinitis due to animal (cat) (dog) hair and dander: Secondary | ICD-10-CM | POA: Diagnosis not present

## 2018-11-07 DIAGNOSIS — H1013 Acute atopic conjunctivitis, bilateral: Secondary | ICD-10-CM | POA: Diagnosis not present

## 2018-11-07 DIAGNOSIS — J4520 Mild intermittent asthma, uncomplicated: Secondary | ICD-10-CM | POA: Diagnosis not present

## 2018-11-10 DIAGNOSIS — J3081 Allergic rhinitis due to animal (cat) (dog) hair and dander: Secondary | ICD-10-CM | POA: Diagnosis not present

## 2018-11-10 DIAGNOSIS — J3089 Other allergic rhinitis: Secondary | ICD-10-CM | POA: Diagnosis not present

## 2018-11-10 DIAGNOSIS — J301 Allergic rhinitis due to pollen: Secondary | ICD-10-CM | POA: Diagnosis not present

## 2018-11-25 DIAGNOSIS — J301 Allergic rhinitis due to pollen: Secondary | ICD-10-CM | POA: Diagnosis not present

## 2018-11-25 DIAGNOSIS — J3081 Allergic rhinitis due to animal (cat) (dog) hair and dander: Secondary | ICD-10-CM | POA: Diagnosis not present

## 2018-11-25 DIAGNOSIS — J3089 Other allergic rhinitis: Secondary | ICD-10-CM | POA: Diagnosis not present

## 2018-11-28 DIAGNOSIS — J3081 Allergic rhinitis due to animal (cat) (dog) hair and dander: Secondary | ICD-10-CM | POA: Diagnosis not present

## 2018-11-28 DIAGNOSIS — J3089 Other allergic rhinitis: Secondary | ICD-10-CM | POA: Diagnosis not present

## 2018-11-28 DIAGNOSIS — J301 Allergic rhinitis due to pollen: Secondary | ICD-10-CM | POA: Diagnosis not present

## 2018-12-03 DIAGNOSIS — J301 Allergic rhinitis due to pollen: Secondary | ICD-10-CM | POA: Diagnosis not present

## 2018-12-03 DIAGNOSIS — J3081 Allergic rhinitis due to animal (cat) (dog) hair and dander: Secondary | ICD-10-CM | POA: Diagnosis not present

## 2018-12-03 DIAGNOSIS — J3089 Other allergic rhinitis: Secondary | ICD-10-CM | POA: Diagnosis not present

## 2018-12-09 DIAGNOSIS — J301 Allergic rhinitis due to pollen: Secondary | ICD-10-CM | POA: Diagnosis not present

## 2018-12-09 DIAGNOSIS — J3081 Allergic rhinitis due to animal (cat) (dog) hair and dander: Secondary | ICD-10-CM | POA: Diagnosis not present

## 2018-12-09 DIAGNOSIS — J3089 Other allergic rhinitis: Secondary | ICD-10-CM | POA: Diagnosis not present

## 2018-12-12 DIAGNOSIS — J3081 Allergic rhinitis due to animal (cat) (dog) hair and dander: Secondary | ICD-10-CM | POA: Diagnosis not present

## 2018-12-12 DIAGNOSIS — J301 Allergic rhinitis due to pollen: Secondary | ICD-10-CM | POA: Diagnosis not present

## 2018-12-12 DIAGNOSIS — J3089 Other allergic rhinitis: Secondary | ICD-10-CM | POA: Diagnosis not present

## 2018-12-18 DIAGNOSIS — J301 Allergic rhinitis due to pollen: Secondary | ICD-10-CM | POA: Diagnosis not present

## 2018-12-18 DIAGNOSIS — J3089 Other allergic rhinitis: Secondary | ICD-10-CM | POA: Diagnosis not present

## 2018-12-18 DIAGNOSIS — J3081 Allergic rhinitis due to animal (cat) (dog) hair and dander: Secondary | ICD-10-CM | POA: Diagnosis not present

## 2018-12-19 ENCOUNTER — Ambulatory Visit (HOSPITAL_BASED_OUTPATIENT_CLINIC_OR_DEPARTMENT_OTHER)
Admission: RE | Admit: 2018-12-19 | Discharge: 2018-12-19 | Disposition: A | Discharge status: Home / Self Care | Payer: BLUE CROSS/BLUE SHIELD | Source: Ambulatory Visit | Attending: Internal Medicine | Admitting: Internal Medicine

## 2018-12-19 ENCOUNTER — Ambulatory Visit: Payer: BLUE CROSS/BLUE SHIELD | Admitting: Internal Medicine

## 2018-12-19 ENCOUNTER — Encounter: Payer: Self-pay | Admitting: Internal Medicine

## 2018-12-19 VITALS — BP 118/80 | HR 68 | Temp 97.8°F | Resp 16 | Ht 72.0 in | Wt 209.0 lb

## 2018-12-19 DIAGNOSIS — R1010 Upper abdominal pain, unspecified: Secondary | ICD-10-CM | POA: Insufficient documentation

## 2018-12-19 DIAGNOSIS — R079 Chest pain, unspecified: Secondary | ICD-10-CM | POA: Diagnosis not present

## 2018-12-19 LAB — COMPREHENSIVE METABOLIC PANEL
ALK PHOS: 69 U/L (ref 39–117)
ALT: 36 U/L (ref 0–53)
AST: 22 U/L (ref 0–37)
Albumin: 4.6 g/dL (ref 3.5–5.2)
BILIRUBIN TOTAL: 0.9 mg/dL (ref 0.2–1.2)
BUN: 10 mg/dL (ref 6–23)
CALCIUM: 9.5 mg/dL (ref 8.4–10.5)
CO2: 32 meq/L (ref 19–32)
CREATININE: 0.75 mg/dL (ref 0.40–1.50)
Chloride: 101 mEq/L (ref 96–112)
GFR: 112.24 mL/min (ref >60.00)
Glucose, Bld: 90 mg/dL (ref 70–99)
Potassium: 4.5 mEq/L (ref 3.5–5.1)
Sodium: 139 mEq/L (ref 135–145)
TOTAL PROTEIN: 6.9 g/dL (ref 6.0–8.3)

## 2018-12-19 LAB — CBC WITH DIFFERENTIAL/PLATELET
BASOS ABS: 0 10*3/uL (ref 0.0–0.1)
Basophils Relative: 0.7 % (ref 0.0–3.0)
EOS ABS: 0.3 10*3/uL (ref 0.0–0.7)
Eosinophils Relative: 4.7 % (ref 0.0–5.0)
HEMATOCRIT: 43.7 % (ref 39.0–52.0)
Hemoglobin: 15.4 g/dL (ref 13.0–17.0)
LYMPHS PCT: 29.3 % (ref 12.0–46.0)
Lymphs Abs: 1.9 10*3/uL (ref 0.7–4.0)
MCHC: 35.2 g/dL (ref 30.0–36.0)
MCV: 91.8 fl (ref 78.0–100.0)
MONOS PCT: 5.5 % (ref 3.0–12.0)
Monocytes Absolute: 0.4 10*3/uL (ref 0.1–1.0)
NEUTROS ABS: 3.9 10*3/uL (ref 1.4–7.7)
Neutrophils Relative %: 59.8 % (ref 43.0–77.0)
PLATELETS: 236 10*3/uL (ref 150.0–400.0)
RBC: 4.76 Mil/uL (ref 4.22–5.81)
RDW: 12.2 % (ref 11.5–15.5)
WBC: 6.5 10*3/uL (ref 4.0–10.5)

## 2018-12-19 LAB — AMYLASE: Amylase: 41 U/L (ref 27–131)

## 2018-12-19 LAB — LIPASE: LIPASE: 39 U/L (ref 11.0–59.0)

## 2018-12-19 NOTE — Patient Instructions (Addendum)
GO TO THE LAB : Get the blood work     GO TO THE FRONT DESK Schedule your next appointment for a physical exam by 02/2019    STOP BY THE FIRST FLOOR:  get the XR

## 2018-12-19 NOTE — Progress Notes (Signed)
Pre visit review using our clinic review tool, if applicable. No additional management support is needed unless otherwise documented below in the visit note. 

## 2018-12-19 NOTE — Progress Notes (Signed)
Subjective:    Patient ID: David Lowery, male    DOB: 01-19-1971, 48 y.o.   MRN: [id number]  DOS:  12/19/2018 Type of visit - description: Acute visit Sx started approximately 3 weeks ago with "soreness" at the upper abdomen bilaterally. Described the pain as similar to "when you do sit ups at the gym". The pain is no worse by moving his torso, somewhat worse by taking deep breaths and more noticeable after he eats. not taking any medication for it.  He was seen with dyspepsia several months ago, symptoms were largely resolved.   Review of Systems No fever chills No nausea, vomiting, diarrhea or blood in the stools. Appetite is normal No recent NSAID intake. No rash No thoracic or back pain. No recent cough.  Past Medical History:  Diagnosis Date  . Anxiety   . Asthma due to seasonal allergies   . OSA (obstructive sleep apnea)    initial sx: palpitations-fatigue--s/p surgery, much improved     Past Surgical History:  Procedure Laterality Date  . CHOLECYSTECTOMY N/A 12/06/2016   Procedure: LAPAROSCOPIC CHOLECYSTECTOMY WITH INTRAOPERATIVE CHOLANGIOGRAM;  Surgeon: Autumn Messing III, MD;  Location: [facility];  Service: General;  Laterality: N/A;  . MASS EXCISION N/A 08/31/2017   Procedure: MINOR EXCISION SEBACEOUS CYST ON BACK X 2;  Surgeon: Jovita Kussmaul, MD;  Location: [facility];  Service: General;  Laterality: N/A;  . THROAT SURGERY  2011   tonsilectomy, uvulectomy d/t OSA     Social History   Socioeconomic History  . Marital status: Married    Spouse name: Not on file  . Number of children: 1  . Years of education: Not on file  . Highest education level: Not on file  Occupational History  . Occupation: Pharmacologist: Geneticist, molecular    Comment: [place]  . Financial resource strain: Not on file  . Food insecurity:    Worry: Not on file    Inability: Not on file  . Transportation needs:    Medical: Not on file   Non-medical: Not on file  Tobacco Use  . Smoking status: Never Smoker  . Smokeless tobacco: Never Used  Substance and Sexual Activity  . Alcohol use: No    Comment: socially   . Drug use: No  . Sexual activity: Not on file  Lifestyle  . Physical activity:    Days per week: Not on file    Minutes per session: Not on file  . Stress: Not on file  Relationships  . Social connections:    Talks on phone: Not on file    Gets together: Not on file    Attends religious service: Not on file    Active member of club or organization: Not on file    Attends meetings of clubs or organizations: Not on file    Relationship status: Not on file  . Intimate partner violence:    Fear of current or ex partner: Not on file    Emotionally abused: Not on file    Physically abused: Not on file    Forced sexual activity: Not on file  Other Topics Concern  . Not on file  Social History Narrative   Moved to [facility] July 2014, from [employer]   Son born ~ 2012      Allergies as of 12/19/2018      Reactions   Sudafed [pseudoephedrine] Anxiety, Palpitations  Medication List       Accurate as of December 19, 2018 10:36 AM. Always use your most recent med list.        albuterol 108 (90 Base) MCG/ACT inhaler Commonly known as:  VENTOLIN HFA Inhale 2 puffs into the lungs every 6 (six) hours as needed for wheezing or shortness of breath.   fexofenadine 180 MG tablet Commonly known as:  ALLEGRA Take 180 mg by mouth daily as needed for allergies or rhinitis.   mometasone 50 MCG/ACT nasal spray Commonly known as:  NASONEX Place 2 sprays into the nose daily as needed.   sertraline 100 MG tablet Commonly known as:  ZOLOFT Take 1 tablet (100 mg total) by mouth daily.           Objective:   Physical Exam BP 118/80 (BP Location: Left Arm, Patient Position: Sitting, Cuff Size: Normal)   Pulse 68   Temp 97.8 F (36.6 C) (Oral)   Resp 16   Ht 6' (1.829 m)   Wt 209 lb (94.8 kg)    SpO2 99%   BMI 28.35 kg/m  General:   Well developed, NAD, BMI noted.  HEENT:  Normocephalic . Face symmetric, atraumatic Lungs:  CTA B Normal respiratory effort, no intercostal retractions, no accessory muscle use. Heart: RRR,  no murmur.  no pretibial edema bilaterally  Abdomen:  Not distended, soft, non-tender. No rebound or rigidity.  No organomegaly to palpation or percussion Skin: Not pale. Not jaundice Neurologic:  alert & oriented X3.  Speech normal, gait appropriate for age and unassisted Psych--  Cognition and judgment appear intact.  Cooperative with normal attention span and concentration.  Behavior appropriate. No anxious or depressed appearing.     Assessment     Assessment OSA -initial sx: palpitations-fatigue--s/p uvulectomy, osa  much improved  Anxiety Allergies, bronchospasm Hernia L , saw surgery 2015, offered surgery Actinic keratosis, nevus -- saw derm, excisions 2015   Plan Upper abdominal pain bilaterally: Symptoms started 3 weeks ago, had a cholecystectomy 11/2016. Etiology not clear, review of systems is reassuring. Plan: Chest x-ray, CMP, CBC, amylase lipase.  Otherwise we agreed on observation.  Further eval if he is not gradually improving or if his symptoms are worse.  He agreed with plan. RTC CPX around 02/2019

## 2018-12-20 NOTE — Assessment & Plan Note (Signed)
Upper abdominal pain bilaterally: Symptoms started 3 weeks ago, had a cholecystectomy 11/2016. Etiology not clear, review of systems is reassuring. Plan: Chest x-ray, CMP, CBC, amylase lipase.  Otherwise we agreed on observation.  Further eval if he is not gradually improving or if his symptoms are worse.  He agreed with plan. RTC CPX around 02/2019

## 2018-12-23 DIAGNOSIS — J3081 Allergic rhinitis due to animal (cat) (dog) hair and dander: Secondary | ICD-10-CM | POA: Diagnosis not present

## 2018-12-23 DIAGNOSIS — J3089 Other allergic rhinitis: Secondary | ICD-10-CM | POA: Diagnosis not present

## 2018-12-23 DIAGNOSIS — J301 Allergic rhinitis due to pollen: Secondary | ICD-10-CM | POA: Diagnosis not present

## 2018-12-26 DIAGNOSIS — J3089 Other allergic rhinitis: Secondary | ICD-10-CM | POA: Diagnosis not present

## 2018-12-26 DIAGNOSIS — J3081 Allergic rhinitis due to animal (cat) (dog) hair and dander: Secondary | ICD-10-CM | POA: Diagnosis not present

## 2018-12-26 DIAGNOSIS — J301 Allergic rhinitis due to pollen: Secondary | ICD-10-CM | POA: Diagnosis not present

## 2018-12-31 DIAGNOSIS — J301 Allergic rhinitis due to pollen: Secondary | ICD-10-CM | POA: Diagnosis not present

## 2018-12-31 DIAGNOSIS — J3089 Other allergic rhinitis: Secondary | ICD-10-CM | POA: Diagnosis not present

## 2018-12-31 DIAGNOSIS — J3081 Allergic rhinitis due to animal (cat) (dog) hair and dander: Secondary | ICD-10-CM | POA: Diagnosis not present

## 2019-01-02 DIAGNOSIS — J3089 Other allergic rhinitis: Secondary | ICD-10-CM | POA: Diagnosis not present

## 2019-01-02 DIAGNOSIS — J301 Allergic rhinitis due to pollen: Secondary | ICD-10-CM | POA: Diagnosis not present

## 2019-01-02 DIAGNOSIS — J3081 Allergic rhinitis due to animal (cat) (dog) hair and dander: Secondary | ICD-10-CM | POA: Diagnosis not present

## 2019-01-08 DIAGNOSIS — J3081 Allergic rhinitis due to animal (cat) (dog) hair and dander: Secondary | ICD-10-CM | POA: Diagnosis not present

## 2019-01-08 DIAGNOSIS — J3089 Other allergic rhinitis: Secondary | ICD-10-CM | POA: Diagnosis not present

## 2019-01-08 DIAGNOSIS — J301 Allergic rhinitis due to pollen: Secondary | ICD-10-CM | POA: Diagnosis not present

## 2019-01-14 DIAGNOSIS — J3081 Allergic rhinitis due to animal (cat) (dog) hair and dander: Secondary | ICD-10-CM | POA: Diagnosis not present

## 2019-01-14 DIAGNOSIS — J301 Allergic rhinitis due to pollen: Secondary | ICD-10-CM | POA: Diagnosis not present

## 2019-01-14 DIAGNOSIS — J3089 Other allergic rhinitis: Secondary | ICD-10-CM | POA: Diagnosis not present

## 2019-01-23 DIAGNOSIS — J3089 Other allergic rhinitis: Secondary | ICD-10-CM | POA: Diagnosis not present

## 2019-01-23 DIAGNOSIS — J301 Allergic rhinitis due to pollen: Secondary | ICD-10-CM | POA: Diagnosis not present

## 2019-01-23 DIAGNOSIS — J3081 Allergic rhinitis due to animal (cat) (dog) hair and dander: Secondary | ICD-10-CM | POA: Diagnosis not present

## 2019-02-11 DIAGNOSIS — J301 Allergic rhinitis due to pollen: Secondary | ICD-10-CM | POA: Diagnosis not present

## 2019-02-11 DIAGNOSIS — J3089 Other allergic rhinitis: Secondary | ICD-10-CM | POA: Diagnosis not present

## 2019-02-11 DIAGNOSIS — J3081 Allergic rhinitis due to animal (cat) (dog) hair and dander: Secondary | ICD-10-CM | POA: Diagnosis not present

## 2019-02-14 MED ORDER — SERTRALINE HCL 100 MG PO TABS: 100.0000 mg | ORAL_TABLET | Freq: Every day | ORAL | 1 refills | Status: DC

## 2019-02-25 DIAGNOSIS — J3089 Other allergic rhinitis: Secondary | ICD-10-CM | POA: Diagnosis not present

## 2019-02-25 DIAGNOSIS — J3081 Allergic rhinitis due to animal (cat) (dog) hair and dander: Secondary | ICD-10-CM | POA: Diagnosis not present

## 2019-02-25 DIAGNOSIS — J301 Allergic rhinitis due to pollen: Secondary | ICD-10-CM | POA: Diagnosis not present

## 2019-03-16 ENCOUNTER — Other Ambulatory Visit: Payer: Self-pay | Admitting: Internal Medicine

## 2019-03-27 DIAGNOSIS — J3081 Allergic rhinitis due to animal (cat) (dog) hair and dander: Secondary | ICD-10-CM | POA: Diagnosis not present

## 2019-03-27 DIAGNOSIS — J301 Allergic rhinitis due to pollen: Secondary | ICD-10-CM | POA: Diagnosis not present

## 2019-03-27 DIAGNOSIS — J3089 Other allergic rhinitis: Secondary | ICD-10-CM | POA: Diagnosis not present

## 2019-04-03 DIAGNOSIS — J3081 Allergic rhinitis due to animal (cat) (dog) hair and dander: Secondary | ICD-10-CM | POA: Diagnosis not present

## 2019-04-03 DIAGNOSIS — J301 Allergic rhinitis due to pollen: Secondary | ICD-10-CM | POA: Diagnosis not present

## 2019-04-03 DIAGNOSIS — J3089 Other allergic rhinitis: Secondary | ICD-10-CM | POA: Diagnosis not present

## 2019-04-10 DIAGNOSIS — J3089 Other allergic rhinitis: Secondary | ICD-10-CM | POA: Diagnosis not present

## 2019-04-10 DIAGNOSIS — J3081 Allergic rhinitis due to animal (cat) (dog) hair and dander: Secondary | ICD-10-CM | POA: Diagnosis not present

## 2019-04-10 DIAGNOSIS — J301 Allergic rhinitis due to pollen: Secondary | ICD-10-CM | POA: Diagnosis not present

## 2019-04-17 DIAGNOSIS — J3089 Other allergic rhinitis: Secondary | ICD-10-CM | POA: Diagnosis not present

## 2019-04-17 DIAGNOSIS — J3081 Allergic rhinitis due to animal (cat) (dog) hair and dander: Secondary | ICD-10-CM | POA: Diagnosis not present

## 2019-04-17 DIAGNOSIS — J301 Allergic rhinitis due to pollen: Secondary | ICD-10-CM | POA: Diagnosis not present

## 2019-04-24 DIAGNOSIS — J3089 Other allergic rhinitis: Secondary | ICD-10-CM | POA: Diagnosis not present

## 2019-04-24 DIAGNOSIS — J3081 Allergic rhinitis due to animal (cat) (dog) hair and dander: Secondary | ICD-10-CM | POA: Diagnosis not present

## 2019-04-24 DIAGNOSIS — J301 Allergic rhinitis due to pollen: Secondary | ICD-10-CM | POA: Diagnosis not present

## 2019-05-08 DIAGNOSIS — J301 Allergic rhinitis due to pollen: Secondary | ICD-10-CM | POA: Diagnosis not present

## 2019-05-08 DIAGNOSIS — J3081 Allergic rhinitis due to animal (cat) (dog) hair and dander: Secondary | ICD-10-CM | POA: Diagnosis not present

## 2019-05-08 DIAGNOSIS — J3089 Other allergic rhinitis: Secondary | ICD-10-CM | POA: Diagnosis not present

## 2019-05-15 DIAGNOSIS — J3089 Other allergic rhinitis: Secondary | ICD-10-CM | POA: Diagnosis not present

## 2019-05-15 DIAGNOSIS — J3081 Allergic rhinitis due to animal (cat) (dog) hair and dander: Secondary | ICD-10-CM | POA: Diagnosis not present

## 2019-05-15 DIAGNOSIS — J301 Allergic rhinitis due to pollen: Secondary | ICD-10-CM | POA: Diagnosis not present

## 2019-05-20 DIAGNOSIS — J301 Allergic rhinitis due to pollen: Secondary | ICD-10-CM | POA: Diagnosis not present

## 2019-05-20 DIAGNOSIS — J3081 Allergic rhinitis due to animal (cat) (dog) hair and dander: Secondary | ICD-10-CM | POA: Diagnosis not present

## 2019-05-20 DIAGNOSIS — J3089 Other allergic rhinitis: Secondary | ICD-10-CM | POA: Diagnosis not present

## 2019-05-27 DIAGNOSIS — J3081 Allergic rhinitis due to animal (cat) (dog) hair and dander: Secondary | ICD-10-CM | POA: Diagnosis not present

## 2019-05-27 DIAGNOSIS — J3089 Other allergic rhinitis: Secondary | ICD-10-CM | POA: Diagnosis not present

## 2019-05-27 DIAGNOSIS — J301 Allergic rhinitis due to pollen: Secondary | ICD-10-CM | POA: Diagnosis not present

## 2019-06-10 DIAGNOSIS — J3081 Allergic rhinitis due to animal (cat) (dog) hair and dander: Secondary | ICD-10-CM | POA: Diagnosis not present

## 2019-06-10 DIAGNOSIS — J3089 Other allergic rhinitis: Secondary | ICD-10-CM | POA: Diagnosis not present

## 2019-06-10 DIAGNOSIS — J301 Allergic rhinitis due to pollen: Secondary | ICD-10-CM | POA: Diagnosis not present

## 2019-06-17 DIAGNOSIS — J301 Allergic rhinitis due to pollen: Secondary | ICD-10-CM | POA: Diagnosis not present

## 2019-06-17 DIAGNOSIS — J3089 Other allergic rhinitis: Secondary | ICD-10-CM | POA: Diagnosis not present

## 2019-06-17 DIAGNOSIS — J3081 Allergic rhinitis due to animal (cat) (dog) hair and dander: Secondary | ICD-10-CM | POA: Diagnosis not present

## 2019-06-24 DIAGNOSIS — J3081 Allergic rhinitis due to animal (cat) (dog) hair and dander: Secondary | ICD-10-CM | POA: Diagnosis not present

## 2019-06-24 DIAGNOSIS — J301 Allergic rhinitis due to pollen: Secondary | ICD-10-CM | POA: Diagnosis not present

## 2019-06-24 DIAGNOSIS — J3089 Other allergic rhinitis: Secondary | ICD-10-CM | POA: Diagnosis not present

## 2019-07-01 DIAGNOSIS — J301 Allergic rhinitis due to pollen: Secondary | ICD-10-CM | POA: Diagnosis not present

## 2019-07-01 DIAGNOSIS — J3089 Other allergic rhinitis: Secondary | ICD-10-CM | POA: Diagnosis not present

## 2019-07-01 DIAGNOSIS — J3081 Allergic rhinitis due to animal (cat) (dog) hair and dander: Secondary | ICD-10-CM | POA: Diagnosis not present

## 2019-07-10 DIAGNOSIS — J3089 Other allergic rhinitis: Secondary | ICD-10-CM | POA: Diagnosis not present

## 2019-07-10 DIAGNOSIS — J3081 Allergic rhinitis due to animal (cat) (dog) hair and dander: Secondary | ICD-10-CM | POA: Diagnosis not present

## 2019-07-10 DIAGNOSIS — J301 Allergic rhinitis due to pollen: Secondary | ICD-10-CM | POA: Diagnosis not present

## 2019-07-17 DIAGNOSIS — J3089 Other allergic rhinitis: Secondary | ICD-10-CM | POA: Diagnosis not present

## 2019-07-17 DIAGNOSIS — J3081 Allergic rhinitis due to animal (cat) (dog) hair and dander: Secondary | ICD-10-CM | POA: Diagnosis not present

## 2019-07-17 DIAGNOSIS — J301 Allergic rhinitis due to pollen: Secondary | ICD-10-CM | POA: Diagnosis not present

## 2019-07-24 DIAGNOSIS — J3089 Other allergic rhinitis: Secondary | ICD-10-CM | POA: Diagnosis not present

## 2019-07-24 DIAGNOSIS — J3081 Allergic rhinitis due to animal (cat) (dog) hair and dander: Secondary | ICD-10-CM | POA: Diagnosis not present

## 2019-07-24 DIAGNOSIS — J301 Allergic rhinitis due to pollen: Secondary | ICD-10-CM | POA: Diagnosis not present

## 2019-07-31 DIAGNOSIS — J3089 Other allergic rhinitis: Secondary | ICD-10-CM | POA: Diagnosis not present

## 2019-07-31 DIAGNOSIS — J301 Allergic rhinitis due to pollen: Secondary | ICD-10-CM | POA: Diagnosis not present

## 2019-07-31 DIAGNOSIS — J3081 Allergic rhinitis due to animal (cat) (dog) hair and dander: Secondary | ICD-10-CM | POA: Diagnosis not present

## 2019-08-07 DIAGNOSIS — J3081 Allergic rhinitis due to animal (cat) (dog) hair and dander: Secondary | ICD-10-CM | POA: Diagnosis not present

## 2019-08-07 DIAGNOSIS — J3089 Other allergic rhinitis: Secondary | ICD-10-CM | POA: Diagnosis not present

## 2019-08-07 DIAGNOSIS — J301 Allergic rhinitis due to pollen: Secondary | ICD-10-CM | POA: Diagnosis not present

## 2019-08-14 DIAGNOSIS — J3081 Allergic rhinitis due to animal (cat) (dog) hair and dander: Secondary | ICD-10-CM | POA: Diagnosis not present

## 2019-08-14 DIAGNOSIS — J3089 Other allergic rhinitis: Secondary | ICD-10-CM | POA: Diagnosis not present

## 2019-08-14 DIAGNOSIS — J301 Allergic rhinitis due to pollen: Secondary | ICD-10-CM | POA: Diagnosis not present

## 2019-08-28 DIAGNOSIS — J3089 Other allergic rhinitis: Secondary | ICD-10-CM | POA: Diagnosis not present

## 2019-08-28 DIAGNOSIS — J3081 Allergic rhinitis due to animal (cat) (dog) hair and dander: Secondary | ICD-10-CM | POA: Diagnosis not present

## 2019-08-28 DIAGNOSIS — J301 Allergic rhinitis due to pollen: Secondary | ICD-10-CM | POA: Diagnosis not present

## 2019-09-04 DIAGNOSIS — J301 Allergic rhinitis due to pollen: Secondary | ICD-10-CM | POA: Diagnosis not present

## 2019-09-04 DIAGNOSIS — J3081 Allergic rhinitis due to animal (cat) (dog) hair and dander: Secondary | ICD-10-CM | POA: Diagnosis not present

## 2019-09-04 DIAGNOSIS — J3089 Other allergic rhinitis: Secondary | ICD-10-CM | POA: Diagnosis not present

## 2019-09-16 DIAGNOSIS — J3081 Allergic rhinitis due to animal (cat) (dog) hair and dander: Secondary | ICD-10-CM | POA: Diagnosis not present

## 2019-09-16 DIAGNOSIS — J301 Allergic rhinitis due to pollen: Secondary | ICD-10-CM | POA: Diagnosis not present

## 2019-09-16 DIAGNOSIS — J3089 Other allergic rhinitis: Secondary | ICD-10-CM | POA: Diagnosis not present

## 2019-09-30 DIAGNOSIS — J3089 Other allergic rhinitis: Secondary | ICD-10-CM | POA: Diagnosis not present

## 2019-09-30 DIAGNOSIS — J3081 Allergic rhinitis due to animal (cat) (dog) hair and dander: Secondary | ICD-10-CM | POA: Diagnosis not present

## 2019-09-30 DIAGNOSIS — J301 Allergic rhinitis due to pollen: Secondary | ICD-10-CM | POA: Diagnosis not present

## 2019-10-11 ENCOUNTER — Other Ambulatory Visit: Payer: Self-pay | Admitting: Internal Medicine

## 2019-10-28 DIAGNOSIS — J3089 Other allergic rhinitis: Secondary | ICD-10-CM | POA: Diagnosis not present

## 2019-10-28 DIAGNOSIS — J3081 Allergic rhinitis due to animal (cat) (dog) hair and dander: Secondary | ICD-10-CM | POA: Diagnosis not present

## 2019-10-28 DIAGNOSIS — J301 Allergic rhinitis due to pollen: Secondary | ICD-10-CM | POA: Diagnosis not present

## 2019-11-02 ENCOUNTER — Other Ambulatory Visit: Payer: Self-pay | Admitting: Internal Medicine

## 2019-11-10 DIAGNOSIS — J301 Allergic rhinitis due to pollen: Secondary | ICD-10-CM | POA: Diagnosis not present

## 2019-11-10 DIAGNOSIS — J3081 Allergic rhinitis due to animal (cat) (dog) hair and dander: Secondary | ICD-10-CM | POA: Diagnosis not present

## 2019-11-10 DIAGNOSIS — J3089 Other allergic rhinitis: Secondary | ICD-10-CM | POA: Diagnosis not present

## 2019-11-11 DIAGNOSIS — J3081 Allergic rhinitis due to animal (cat) (dog) hair and dander: Secondary | ICD-10-CM | POA: Diagnosis not present

## 2019-11-11 DIAGNOSIS — J301 Allergic rhinitis due to pollen: Secondary | ICD-10-CM | POA: Diagnosis not present

## 2019-11-11 DIAGNOSIS — J3089 Other allergic rhinitis: Secondary | ICD-10-CM | POA: Diagnosis not present

## 2019-11-25 DIAGNOSIS — J3089 Other allergic rhinitis: Secondary | ICD-10-CM | POA: Diagnosis not present

## 2019-11-25 DIAGNOSIS — J3081 Allergic rhinitis due to animal (cat) (dog) hair and dander: Secondary | ICD-10-CM | POA: Diagnosis not present

## 2019-11-25 DIAGNOSIS — J301 Allergic rhinitis due to pollen: Secondary | ICD-10-CM | POA: Diagnosis not present

## 2019-12-09 DIAGNOSIS — J3089 Other allergic rhinitis: Secondary | ICD-10-CM | POA: Diagnosis not present

## 2019-12-09 DIAGNOSIS — J301 Allergic rhinitis due to pollen: Secondary | ICD-10-CM | POA: Diagnosis not present

## 2019-12-09 DIAGNOSIS — J3081 Allergic rhinitis due to animal (cat) (dog) hair and dander: Secondary | ICD-10-CM | POA: Diagnosis not present

## 2019-12-13 ENCOUNTER — Other Ambulatory Visit: Payer: Self-pay | Admitting: Internal Medicine

## 2019-12-15 ENCOUNTER — Other Ambulatory Visit: Payer: Self-pay | Admitting: Internal Medicine

## 2019-12-15 MED ORDER — SERTRALINE HCL 100 MG PO TABS: 100.0000 mg | ORAL_TABLET | Freq: Every day | ORAL | 0 refills | Status: DC

## 2019-12-23 DIAGNOSIS — H1013 Acute atopic conjunctivitis, bilateral: Secondary | ICD-10-CM | POA: Diagnosis not present

## 2019-12-23 DIAGNOSIS — J3089 Other allergic rhinitis: Secondary | ICD-10-CM | POA: Diagnosis not present

## 2019-12-23 DIAGNOSIS — J4520 Mild intermittent asthma, uncomplicated: Secondary | ICD-10-CM | POA: Diagnosis not present

## 2019-12-23 DIAGNOSIS — J3081 Allergic rhinitis due to animal (cat) (dog) hair and dander: Secondary | ICD-10-CM | POA: Diagnosis not present

## 2019-12-23 DIAGNOSIS — J301 Allergic rhinitis due to pollen: Secondary | ICD-10-CM | POA: Diagnosis not present

## 2020-01-07 ENCOUNTER — Other Ambulatory Visit: Payer: Self-pay

## 2020-01-07 ENCOUNTER — Ambulatory Visit: Payer: BC Managed Care – PPO | Admitting: Internal Medicine

## 2020-01-07 ENCOUNTER — Encounter: Payer: Self-pay | Admitting: Internal Medicine

## 2020-01-07 VITALS — BP 145/75 | HR 82 | Temp 98.2°F | Resp 16 | Ht 72.0 in | Wt 204.4 lb

## 2020-01-07 DIAGNOSIS — R002 Palpitations: Secondary | ICD-10-CM

## 2020-01-07 NOTE — Progress Notes (Signed)
Subjective:    Patient ID: David Lowery, male    DOB: 05-Jul-1971, 49 y.o.   MRN: [id number]  DOS:  01/07/2020 Type of visit - description: Acute Symptoms that started 5 days ago, he had 3 episodes of palpitation, the last one was this morning.  Episode happened only after he get up from bed in the morning, stands up, and takes 2 or 3 steps: Then he feels palpitations described as his heart going really fast. Symptoms last about 3 to 4 minutes. No associated symptoms, see ROS. Denies taking any over-the-counter decongestants, weight loss pills, not using albuterol frequently.   Review of Systems No recent chest pain, difficulty breathing, lower extremity edema. No diaphoresis associated with palpitation No anxiety or depression.  Symptoms controlled. No nausea, vomiting, diarrhea or blood in the stools. No LOC No headache or dizziness.  Past Medical History:  Diagnosis Date  . Anxiety   . Asthma due to seasonal allergies   . OSA (obstructive sleep apnea)    initial sx: palpitations-fatigue--s/p surgery, much improved     Past Surgical History:  Procedure Laterality Date  . CHOLECYSTECTOMY N/A 12/06/2016   Procedure: LAPAROSCOPIC CHOLECYSTECTOMY WITH INTRAOPERATIVE CHOLANGIOGRAM;  Surgeon: Autumn Messing III, MD;  Location: [facility];  Service: General;  Laterality: N/A;  . MASS EXCISION N/A 08/31/2017   Procedure: MINOR EXCISION SEBACEOUS CYST ON BACK X 2;  Surgeon: Jovita Kussmaul, MD;  Location: [facility];  Service: General;  Laterality: N/A;  . THROAT SURGERY  2011   tonsilectomy, uvulectomy d/t OSA     Allergies as of 01/07/2020      Reactions   Sudafed [pseudoephedrine] Anxiety, Palpitations      Medication List       Accurate as of January 07, 2020 11:59 PM. If you have any questions, ask your nurse or doctor.        albuterol 108 (90 Base) MCG/ACT inhaler Commonly known as: Ventolin HFA Inhale 2 puffs into the lungs every 6 (six) hours as needed for  wheezing or shortness of breath.   fexofenadine 180 MG tablet Commonly known as: ALLEGRA Take 180 mg by mouth every other day.   levocetirizine 5 MG tablet Commonly known as: XYZAL Take 5 mg by mouth every other day.   mometasone 50 MCG/ACT nasal spray Commonly known as: NASONEX Place 2 sprays into the nose daily as needed.   sertraline 100 MG tablet Commonly known as: ZOLOFT Take 1 tablet (100 mg total) by mouth daily.          Objective:   Physical Exam BP (!) 145/75 (BP Location: Left Arm, Patient Position: Sitting, Cuff Size: Normal)   Pulse 82   Temp 98.2 F (36.8 C) (Temporal)   Resp 16   Ht 6' (1.829 m)   Wt 204 lb 6 oz (92.7 kg)   SpO2 98%   BMI 27.72 kg/m  General:   Well developed, NAD, BMI noted.  HEENT:  Normocephalic . Face symmetric, atraumatic. No thyromegaly. Lungs:  CTA B Normal respiratory effort, no intercostal retractions, no accessory muscle use. Heart: RRR,  no murmur.  Abdomen:  Not distended, soft, non-tender. No rebound or rigidity.   Skin: Not pale. Not jaundice Lower extremities: no pretibial edema bilaterally  Neurologic:  alert & oriented X3.  Speech normal, gait appropriate for age and unassisted Psych--  Cognition and judgment appear intact.  Cooperative with normal attention span and concentration.  Behavior appropriate. No anxious or depressed appearing.  Assessment     Assessment OSA -initial sx: palpitations-fatigue--s/p uvulectomy, osa  much improved  Anxiety Allergies, bronchospasm Hernia L , saw surgery 2015, offered surgery Actinic keratosis, nevus -- saw derm, excisions 2015   PLAN Palpitations: As described above, no associated symptoms, happening only in the context of getting up after sleeping overnight. Etiology not clear, on no OTCs that could account for the symptoms,? Sympathetic mediated response.  EKG today: NSR, PR normal. Plan:  -CMP, CBC, TSH, cardiology referral -SVT?  As a trial recommend  vagal maneuvers if he has symptoms again, see AVS. -ER if has associated symptoms, see AVS. RTC 3 months CPX   This visit occurred during the SARS-CoV-2 public health emergency.  Safety protocols were in place, including screening questions prior to the visit, additional usage of staff PPE, and extensive cleaning of exam room while observing appropriate contact time as indicated for disinfecting solutions.

## 2020-01-07 NOTE — Progress Notes (Signed)
Pre visit review using our clinic review tool, if applicable. No additional management support is needed unless otherwise documented below in the visit note. 

## 2020-01-07 NOTE — Patient Instructions (Addendum)
GO TO THE LAB : Get the blood work     [place] Come back for a physical exam in 3 months, please make an appointment   Stay well-hydrated  If these happen again do a vagal maneuver: Carotid sinus massage Cold water to your face  ER if you have chest pain, difficulty breathing, nausea, sweats or other serious symptoms.

## 2020-01-08 ENCOUNTER — Encounter: Payer: BLUE CROSS/BLUE SHIELD | Admitting: Internal Medicine

## 2020-01-08 LAB — COMPREHENSIVE METABOLIC PANEL
ALT: 37 U/L (ref 0–53)
AST: 26 U/L (ref 0–37)
Albumin: 4.5 g/dL (ref 3.5–5.2)
Alkaline Phosphatase: 63 U/L (ref 39–117)
BUN: 15 mg/dL (ref 6–23)
CO2: 27 mEq/L (ref 19–32)
Calcium: 9.4 mg/dL (ref 8.4–10.5)
Chloride: 101 mEq/L (ref 96–112)
Creatinine, Ser: 0.77 mg/dL (ref 0.40–1.50)
GFR: 108.38 mL/min (ref >60.00)
Glucose, Bld: 99 mg/dL (ref 70–99)
Potassium: 4 mEq/L (ref 3.5–5.1)
Sodium: 136 mEq/L (ref 135–145)
Total Bilirubin: 0.8 mg/dL (ref 0.2–1.2)
Total Protein: 7.3 g/dL (ref 6.0–8.3)

## 2020-01-08 LAB — CBC WITH DIFFERENTIAL/PLATELET
Basophils Absolute: 0 10*3/uL (ref 0.0–0.1)
Basophils Relative: 0.7 % (ref 0.0–3.0)
Eosinophils Absolute: 0.1 10*3/uL (ref 0.0–0.7)
Eosinophils Relative: 1.4 % (ref 0.0–5.0)
HCT: 43.4 % (ref 39.0–52.0)
Hemoglobin: 15 g/dL (ref 13.0–17.0)
Lymphocytes Relative: 20.5 % (ref 12.0–46.0)
Lymphs Abs: 1.5 10*3/uL (ref 0.7–4.0)
MCHC: 34.6 g/dL (ref 30.0–36.0)
MCV: 92.7 fl (ref 78.0–100.0)
Monocytes Absolute: 0.5 10*3/uL (ref 0.1–1.0)
Monocytes Relative: 6.7 % (ref 3.0–12.0)
Neutro Abs: 5.1 10*3/uL (ref 1.4–7.7)
Neutrophils Relative %: 70.7 % (ref 43.0–77.0)
Platelets: 258 10*3/uL (ref 150.0–400.0)
RBC: 4.68 Mil/uL (ref 4.22–5.81)
RDW: 12.4 % (ref 11.5–15.5)
WBC: 7.3 10*3/uL (ref 4.0–10.5)

## 2020-01-08 LAB — TSH: TSH: 0.77 u[IU]/mL (ref 0.35–4.50)

## 2020-01-09 NOTE — Assessment & Plan Note (Signed)
Palpitations: As described above, no associated symptoms, happening only in the context of getting up after sleeping overnight. Etiology not clear, on no OTCs that could account for the symptoms,? Sympathetic mediated response.  EKG today: NSR, PR normal. Plan:  -CMP, CBC, TSH, cardiology referral -SVT?  As a trial recommend vagal maneuvers if he has symptoms again, see AVS. -ER if has associated symptoms, see AVS. RTC 3 months CPX

## 2020-01-13 DIAGNOSIS — J3089 Other allergic rhinitis: Secondary | ICD-10-CM | POA: Diagnosis not present

## 2020-01-13 DIAGNOSIS — J3081 Allergic rhinitis due to animal (cat) (dog) hair and dander: Secondary | ICD-10-CM | POA: Diagnosis not present

## 2020-01-13 DIAGNOSIS — J301 Allergic rhinitis due to pollen: Secondary | ICD-10-CM | POA: Diagnosis not present

## 2020-01-15 ENCOUNTER — Encounter: Payer: BLUE CROSS/BLUE SHIELD | Admitting: Internal Medicine

## 2020-01-16 ENCOUNTER — Encounter: Payer: Self-pay | Admitting: General Practice

## 2020-01-27 DIAGNOSIS — J301 Allergic rhinitis due to pollen: Secondary | ICD-10-CM | POA: Diagnosis not present

## 2020-01-27 DIAGNOSIS — J3081 Allergic rhinitis due to animal (cat) (dog) hair and dander: Secondary | ICD-10-CM | POA: Diagnosis not present

## 2020-01-27 DIAGNOSIS — J3089 Other allergic rhinitis: Secondary | ICD-10-CM | POA: Diagnosis not present

## 2020-02-06 DIAGNOSIS — L578 Other skin changes due to chronic exposure to nonionizing radiation: Secondary | ICD-10-CM | POA: Diagnosis not present

## 2020-02-06 DIAGNOSIS — C44319 Basal cell carcinoma of skin of other parts of face: Secondary | ICD-10-CM | POA: Diagnosis not present

## 2020-02-09 ENCOUNTER — Ambulatory Visit: Payer: BC Managed Care – PPO | Admitting: Cardiology

## 2020-02-09 ENCOUNTER — Encounter: Payer: Self-pay | Admitting: Cardiology

## 2020-02-09 ENCOUNTER — Ambulatory Visit (INDEPENDENT_AMBULATORY_CARE_PROVIDER_SITE_OTHER): Payer: BC Managed Care – PPO

## 2020-02-09 ENCOUNTER — Telehealth: Payer: Self-pay | Admitting: Cardiology

## 2020-02-09 ENCOUNTER — Other Ambulatory Visit: Payer: Self-pay

## 2020-02-09 VITALS — BP 120/88 | HR 74 | Ht 72.0 in | Wt 205.0 lb

## 2020-02-09 DIAGNOSIS — E781 Pure hyperglyceridemia: Secondary | ICD-10-CM | POA: Insufficient documentation

## 2020-02-09 DIAGNOSIS — F419 Anxiety disorder, unspecified: Secondary | ICD-10-CM

## 2020-02-09 DIAGNOSIS — R002 Palpitations: Secondary | ICD-10-CM

## 2020-02-09 DIAGNOSIS — R0600 Dyspnea, unspecified: Secondary | ICD-10-CM

## 2020-02-09 DIAGNOSIS — R0609 Other forms of dyspnea: Secondary | ICD-10-CM

## 2020-02-09 HISTORY — DX: Palpitations: R00.2

## 2020-02-09 HISTORY — DX: Pure hyperglyceridemia: E78.1

## 2020-02-09 HISTORY — DX: Dyspnea, unspecified: R06.00

## 2020-02-09 HISTORY — DX: Other forms of dyspnea: R06.09

## 2020-02-09 NOTE — Progress Notes (Signed)
Cardiology Consultation:    Date:  02/09/2020   ID:  David Lowery, DOB 22-Feb-1971, MRN [id number]  PCP:  Colon Branch, MD  Cardiologist:  Jenne Campus, MD   Referring MD: Colon Branch, MD   No chief complaint on file. Palpitations  History of Present Illness:    David Lowery is a 49 y.o. male who is being seen today for the evaluation of palpitations at the request of Colon Branch, MD.  About 10 years ago he started having palpitations.  At that time he was working very long hours, he was drinking a lot of caffeinated drinks.  He describes situation when his heart will speed up.  He got himself better, stop drinking caffeinated drinks, palpitations subsided.  However since March he got 2 episodes of tachycardia.  One episode awakened him from sleep in the middle of the night he sweats was flying he was also short of breath, no sweating no chest pain, he got up he walk around he got also some difficulty breathing he said.  Eventually gradual offset and palpitations subsided.  Then noted that he was at work, he works as Advice worker he felt sudden onset of palpitations he checked his watch the calcium heart rate he said it was 136 again the same scenario however no chest pain no sweating some dizziness have to sit down and start taking deep breath and then eventually gradual offset.  Otherwise doing well.  He can exercise and walk with no difficulties he does have some exertional shortness of breath.  He does have history of anxiety treated with Zoloft for it.  No history of smoking, no drinking.  Overall otherwise doing well.  Does not exercise on a regular basis.  Used to go to gym but now because of the edema he does not.   Past Medical History:  Diagnosis Date  . Anxiety   . Asthma due to seasonal allergies   . OSA (obstructive sleep apnea)    initial sx: palpitations-fatigue--s/p surgery, much improved     Past Surgical History:  Procedure Laterality Date  .  CHOLECYSTECTOMY N/A 12/06/2016   Procedure: LAPAROSCOPIC CHOLECYSTECTOMY WITH INTRAOPERATIVE CHOLANGIOGRAM;  Surgeon: Autumn Messing III, MD;  Location: [facility];  Service: General;  Laterality: N/A;  . MASS EXCISION N/A 08/31/2017   Procedure: MINOR EXCISION SEBACEOUS CYST ON BACK X 2;  Surgeon: Autumn Messing III, MD;  Location: [facility];  Service: General;  Laterality: N/A;  . THROAT SURGERY  2011   tonsilectomy, uvulectomy d/t OSA     Current Medications: Current Meds  Medication Sig  . albuterol (VENTOLIN HFA) 108 (90 Base) MCG/ACT inhaler Inhale 2 puffs into the lungs every 6 (six) hours as needed for wheezing or shortness of breath.  . fexofenadine (ALLEGRA) 180 MG tablet Take 180 mg by mouth every other day.   . levocetirizine (XYZAL) 5 MG tablet Take 5 mg by mouth every other day.  . mometasone (NASONEX) 50 MCG/ACT nasal spray Place 2 sprays into the nose daily as needed.  . sertraline (ZOLOFT) 100 MG tablet Take 1 tablet (100 mg total) by mouth daily.     Allergies:   Sudafed [pseudoephedrine]   Social History   Socioeconomic History  . Marital status: Married    Spouse name: Not on file  . Number of children: 1  . Years of education: Not on file  . Highest education level: Not on file  Occupational History  . Occupation:  Pharmacologist: Media planner Services    Comment: [employer]  Tobacco Use  . Smoking status: Never Smoker  . Smokeless tobacco: Never Used  Substance and Sexual Activity  . Alcohol use: No    Comment: socially   . Drug use: No  . Sexual activity: Not on file  Other Topics Concern  . Not on file  Social History Narrative   Moved to [facility] July 2014, from [employer]   Son born ~ 2012   Social Determinants of Health   Financial Resource Strain:   . Difficulty of Paying Living Expenses:   Food Insecurity:   . Worried About Charity fundraiser in the Last Year:   . Arboriculturist in the Last Year:   Transportation  Needs:   . Film/video editor (Medical):   Marland Kitchen Lack of Transportation (Non-Medical):   Physical Activity:   . Days of Exercise per Week:   . Minutes of Exercise per Session:   Stress:   . Feeling of Stress :   Social Connections:   . Frequency of Communication with Friends and Family:   . Frequency of Social Gatherings with Friends and Family:   . Attends Religious Services:   . Active Member of Clubs or Organizations:   . Attends Archivist Meetings:   Marland Kitchen Marital Status:      Family History: [name] family history includes Colon cancer in an other family member; Diabetes in an other family member; Hyperlipidemia in his brother. There is no history of CAD, Stroke, or Prostate cancer. ROS:   Please see the history of present illness.    All 14 point review of systems negative except as described per history of present illness.  EKGs/Labs/Other Studies Reviewed:    The following studies were reviewed today: EKG reviewed from the chart done just few days ago.  Showed normal sinus rhythm, normal P interval, normal QS complex duration morphology, no ST segment changes, normal QT interval.    Recent Labs: 01/07/2020: ALT 37; BUN 15; Creatinine, Ser 0.77; Hemoglobin 15.0; Platelets 258.0; Potassium 4.0; Sodium 136; TSH 0.77  Recent Lipid Panel    Component Value Date/Time   CHOL 187 03/05/2018 1334   TRIG 253.0 (H) 03/05/2018 1334   HDL 37.70 (L) 03/05/2018 1334   CHOLHDL 5 03/05/2018 1334   VLDL 50.6 (H) 03/05/2018 1334   LDLCALC 129 (H) 12/03/2015 0937   LDLDIRECT 98.0 03/05/2018 1334    Physical Exam:    VS:  BP 120/88   Pulse 74   Ht 6' (1.829 m)   Wt 205 lb (93 kg)   SpO2 98%   BMI 27.80 kg/m     Wt Readings from Last 3 Encounters:  02/09/20 205 lb (93 kg)  01/07/20 204 lb 6 oz (92.7 kg)  12/19/18 209 lb (94.8 kg)     GEN:  Well nourished, well developed in no acute distress HEENT: Normal NECK: No JVD; No carotid bruits LYMPHATICS: No  lymphadenopathy CARDIAC: RRR, no murmurs, no rubs, no gallops RESPIRATORY:  Clear to auscultation without rales, wheezing or rhonchi  ABDOMEN: Soft, non-tender, non-distended MUSCULOSKELETAL:  No edema; No deformity  SKIN: Warm and dry NEUROLOGIC:  Alert and oriented x 3 PSYCHIATRIC:  Normal affect   ASSESSMENT:    1. Anxiety   2. Palpitations   3. Dyspnea on exertion   4. Hypertriglyceridemia    PLAN:    In order of problems listed  above:  1. Palpitations.  I will ask him to wear Zio patch for 2 weeks.  We did also talk in length about apple device and watch which allowed him to record his EKG at the time of palpitations.  I will not initiate any medication until diagnosis will be established.  As a part of evaluation I will schedule him to have an echocardiogram to assess left ventricle ejection fraction.  I did review laboratory test done by primary care physician which include TSH which is slightly on the lower side but still within normal limits, electrolytes were within normal limits as well.  I told him that most likely dealing with benign scenario however what may be concern is the fact that he is feeling dizzy as well as he did have some shortness of breath with the first episode.  He does have history of anxiety which could be contributing to this but we need to have a clear-cut diagnosis. 2. Anxiety treated with Zoloft for now.  We will talk about potentially seeing psychotherapist however, he told me from her anxiety point review is doing quite well.  I think we dealing with 2 problems he does have some arrhythmia as well as some anxiety. 3. Dyspnea on exertion.  Nothing extraordinary.  I will schedule him to have echocardiogram to assess left ventricle ejection fraction. 4. Hypertriglyceridemia I told him to avoid carbohydrates as well as exercise on a regular basis.   Medication Adjustments/Labs and Tests Ordered: Current medicines are reviewed at length with the patient  today.  Concerns regarding medicines are outlined above.  No orders of the defined types were placed in this encounter.  No orders of the defined types were placed in this encounter.   Signed, Park Liter, MD, [facility]. 02/09/2020 9:15 AM    [place]

## 2020-02-09 NOTE — Patient Instructions (Addendum)
Medication Instructions:  Your physician recommends that you continue on your current medications as directed. Please refer to the Current Medication list given to you today.  *If you need a refill on your cardiac medications before your next appointment, please call your pharmacy*   Lab Work: none If you have labs (blood work) drawn today and your tests are completely normal, you will receive your results only by: Marland Kitchen MyChart Message (if you have MyChart) OR . A paper copy in the mail If you have any lab test that is abnormal or we need to change your treatment, we will call you to review the results.   Testing/Procedures: 14 day [place] has recommended that you wear a holter monitor. Holter monitors are medical devices that record the heart's electrical activity. Doctors most often use these monitors to diagnose arrhythmias. Arrhythmias are problems with the speed or rhythm of the heartbeat. The monitor is a small, portable device. You can wear one while you do your normal daily activities. This is usually used to diagnose what is causing palpitations/syncope (passing out).  Your physician has requested that you have an echocardiogram. Echocardiography is a painless test that uses sound waves to create images of your heart. It provides your doctor with information about the size and shape of your heart and how well your heart's chambers and valves are working. This procedure takes approximately one hour. There are no restrictions for this procedure.       Follow-Up: At [facility], you and your health needs are our priority.  As part of our continuing mission to provide you with exceptional heart care, we have created designated Provider Care Teams.  These Care Teams include your primary Cardiologist (physician) and Advanced Practice Providers (APPs -  Physician Assistants and Nurse Practitioners) who all work together to provide you with the care you need, when you need  it.  We recommend signing up for the patient portal called "MyChart".  Sign up information is provided on this After Visit Summary.  MyChart is used to connect with patients for Virtual Visits (Telemedicine).  Patients are able to view lab/test results, encounter notes, upcoming appointments, etc.  Non-urgent messages can be sent to your provider as well.   To learn more about what you can do with MyChart, go to [email or web address].    Your next appointment:   6 week(s)  The format for your next appointment:   In Person  Provider:   Jenne Campus, MD   Other Instructions

## 2020-02-09 NOTE — Telephone Encounter (Signed)
Pt called to report his serial number for his Zio patch placed on him today at his appt to register with [facility].

## 2020-02-09 NOTE — Telephone Encounter (Signed)
Patient states he is returning a call from an [facility] about a serial number. I did not see a note.

## 2020-02-10 DIAGNOSIS — J3081 Allergic rhinitis due to animal (cat) (dog) hair and dander: Secondary | ICD-10-CM | POA: Diagnosis not present

## 2020-02-10 DIAGNOSIS — J301 Allergic rhinitis due to pollen: Secondary | ICD-10-CM | POA: Diagnosis not present

## 2020-02-10 DIAGNOSIS — J3089 Other allergic rhinitis: Secondary | ICD-10-CM | POA: Diagnosis not present

## 2020-02-11 ENCOUNTER — Ambulatory Visit (HOSPITAL_BASED_OUTPATIENT_CLINIC_OR_DEPARTMENT_OTHER): Payer: BC Managed Care – PPO

## 2020-02-23 ENCOUNTER — Ambulatory Visit (HOSPITAL_BASED_OUTPATIENT_CLINIC_OR_DEPARTMENT_OTHER): Admission: RE | Admit: 2020-02-23 | Payer: BC Managed Care – PPO | Source: Ambulatory Visit

## 2020-02-24 DIAGNOSIS — J3081 Allergic rhinitis due to animal (cat) (dog) hair and dander: Secondary | ICD-10-CM | POA: Diagnosis not present

## 2020-02-24 DIAGNOSIS — J301 Allergic rhinitis due to pollen: Secondary | ICD-10-CM | POA: Diagnosis not present

## 2020-02-24 DIAGNOSIS — J3089 Other allergic rhinitis: Secondary | ICD-10-CM | POA: Diagnosis not present

## 2020-03-01 ENCOUNTER — Other Ambulatory Visit: Payer: Self-pay

## 2020-03-01 ENCOUNTER — Ambulatory Visit (HOSPITAL_BASED_OUTPATIENT_CLINIC_OR_DEPARTMENT_OTHER)
Admission: RE | Admit: 2020-03-01 | Discharge: 2020-03-01 | Disposition: A | Discharge status: Home / Self Care | Payer: BC Managed Care – PPO | Source: Ambulatory Visit | Attending: Cardiology | Admitting: Cardiology

## 2020-03-01 DIAGNOSIS — E781 Pure hyperglyceridemia: Secondary | ICD-10-CM | POA: Diagnosis not present

## 2020-03-01 DIAGNOSIS — F419 Anxiety disorder, unspecified: Secondary | ICD-10-CM | POA: Diagnosis not present

## 2020-03-01 DIAGNOSIS — R0600 Dyspnea, unspecified: Secondary | ICD-10-CM | POA: Insufficient documentation

## 2020-03-01 DIAGNOSIS — R002 Palpitations: Secondary | ICD-10-CM | POA: Diagnosis not present

## 2020-03-01 DIAGNOSIS — R0609 Other forms of dyspnea: Secondary | ICD-10-CM

## 2020-03-01 NOTE — Progress Notes (Signed)
  Echocardiogram 2D Echocardiogram has been performed.  David Lowery 03/01/2020, 8:33 AM

## 2020-03-04 DIAGNOSIS — C4401 Basal cell carcinoma of skin of lip: Secondary | ICD-10-CM | POA: Diagnosis not present

## 2020-03-08 ENCOUNTER — Telehealth: Payer: Self-pay | Admitting: Emergency Medicine

## 2020-03-08 DIAGNOSIS — Q249 Congenital malformation of heart, unspecified: Secondary | ICD-10-CM

## 2020-03-08 NOTE — Telephone Encounter (Signed)
-----   Message from Park Liter, MD sent at 03/04/2020  6:35 PM EDT ----- His echocardiogram showed possibility of noncompaction.  Please schedule for heart MRI

## 2020-03-09 DIAGNOSIS — J3081 Allergic rhinitis due to animal (cat) (dog) hair and dander: Secondary | ICD-10-CM | POA: Diagnosis not present

## 2020-03-09 DIAGNOSIS — Z20828 Contact with and (suspected) exposure to other viral communicable diseases: Secondary | ICD-10-CM | POA: Diagnosis not present

## 2020-03-09 DIAGNOSIS — J301 Allergic rhinitis due to pollen: Secondary | ICD-10-CM | POA: Diagnosis not present

## 2020-03-09 DIAGNOSIS — J3089 Other allergic rhinitis: Secondary | ICD-10-CM | POA: Diagnosis not present

## 2020-03-09 DIAGNOSIS — Z03818 Encounter for observation for suspected exposure to other biological agents ruled out: Secondary | ICD-10-CM | POA: Diagnosis not present

## 2020-03-10 DIAGNOSIS — R000 Tachycardia, unspecified: Secondary | ICD-10-CM | POA: Diagnosis not present

## 2020-03-11 ENCOUNTER — Encounter: Payer: Self-pay | Admitting: Cardiology

## 2020-03-11 ENCOUNTER — Telehealth: Payer: Self-pay | Admitting: Cardiology

## 2020-03-11 NOTE — Telephone Encounter (Signed)
Spoke with patient regarding appointment for Cardiac MRI scheduled Monday 04/12/20 at 8:00 am at [facility]---arrival time :15 am 1st floor admissions offiice.  Will mail information to patient and he voiced his understanding.

## 2020-03-15 ENCOUNTER — Encounter: Payer: Self-pay | Admitting: Internal Medicine

## 2020-03-15 ENCOUNTER — Ambulatory Visit (INDEPENDENT_AMBULATORY_CARE_PROVIDER_SITE_OTHER): Payer: BC Managed Care – PPO | Admitting: Internal Medicine

## 2020-03-15 ENCOUNTER — Other Ambulatory Visit: Payer: Self-pay

## 2020-03-15 VITALS — BP 133/75 | HR 65 | Temp 98.3°F | Resp 16 | Ht 72.0 in | Wt 203.1 lb

## 2020-03-15 DIAGNOSIS — F419 Anxiety disorder, unspecified: Secondary | ICD-10-CM | POA: Diagnosis not present

## 2020-03-15 DIAGNOSIS — G4733 Obstructive sleep apnea (adult) (pediatric): Secondary | ICD-10-CM | POA: Diagnosis not present

## 2020-03-15 DIAGNOSIS — R002 Palpitations: Secondary | ICD-10-CM

## 2020-03-15 MED ORDER — SERTRALINE HCL 100 MG PO TABS: 150.0000 mg | ORAL_TABLET | Freq: Every day | ORAL | 0 refills | Status: DC

## 2020-03-15 NOTE — Progress Notes (Signed)
Pre visit review using our clinic review tool, if applicable. No additional management support is needed unless otherwise documented below in the visit note. 

## 2020-03-15 NOTE — Assessment & Plan Note (Signed)
Anxiety: Has been well controlled on sertraline 100 mg for years.  Symptoms are gradually resurfaced in the last 2 years, no depression at all. Triggers are unclear. We talked about options, he is open to counseling, information provided. Discussed change SSRI or increase sertraline, we agreed to increase sertraline to 150 mg. Reassess on return to the office. Palpitations: Saw cardiology, monitor showing rare PVCs and APCs and a very short run of SVT.  Echo 01/2020 was not completely normal,hey noted increased trabeculation at the apex of the LV, consider noncompaction, MRI was recommended. OSA: DX was years ago, improve after uvulectomy, mild snoring has resurfaced lately without fatigue, we agreed to  monitor the issue and ESS at some point.  Occult OSA may cause mood changes. RTC 6 weeks CPX

## 2020-03-15 NOTE — Patient Instructions (Addendum)
Happy early Rudene Anda!   COVID-19 Vaccine Information can be found at: ShippingScam.co.uk For questions related to vaccine distribution or appointments, please email vaccine@[email or web address] [email or web address] or call [phone].   Increase sertraline to 1.5 tablets daily   GO TO THE FRONT DESK, PLEASE SCHEDULE YOUR APPOINTMENTS Come back for for a physical exam in 6 weeks

## 2020-03-15 NOTE — Progress Notes (Signed)
Subjective:    Patient ID: David Lowery, male    DOB: [birth year], 49 y.o.   MRN: [id number]  DOS:  03/15/2020 Type of visit - description: Acute Chief complaint today is anxiety.  Symptoms well controlled for long time on sertraline, for the last 2 years symptoms are resurfacing slowly: Moody, anxious, waves of anxiety. He denies problems with depression. Sleep is okay most of the time  Palpitations: Work-up reviewed  History of for sleep apnea, in the last couple of years some snoring has returned but mild and he denies feeling sleepy or tired.  Review of Systems See above   Past Medical History:  Diagnosis Date  . Anxiety   . Asthma due to seasonal allergies   . OSA (obstructive sleep apnea)    initial sx: palpitations-fatigue--s/p surgery, much improved     Past Surgical History:  Procedure Laterality Date  . CHOLECYSTECTOMY N/A 12/06/2016   Procedure: LAPAROSCOPIC CHOLECYSTECTOMY WITH INTRAOPERATIVE CHOLANGIOGRAM;  Surgeon: Autumn Messing III, MD;  Location: [facility];  Service: General;  Laterality: N/A;  . MASS EXCISION N/A 08/31/2017   Procedure: MINOR EXCISION SEBACEOUS CYST ON BACK X 2;  Surgeon: Jovita Kussmaul, MD;  Location: [facility];  Service: General;  Laterality: N/A;  . THROAT SURGERY  2011   tonsilectomy, uvulectomy d/t OSA     Allergies as of 03/15/2020      Reactions   Sudafed [pseudoephedrine] Anxiety, Palpitations      Medication List       Accurate as of Mar 15, 2020  9:27 PM. If you have any questions, ask your nurse or doctor.        albuterol 108 (90 Base) MCG/ACT inhaler Commonly known as: Ventolin HFA Inhale 2 puffs into the lungs every 6 (six) hours as needed for wheezing or shortness of breath.   fexofenadine 180 MG tablet Commonly known as: ALLEGRA Take 180 mg by mouth every other day.   levocetirizine 5 MG tablet Commonly known as: XYZAL Take 5 mg by mouth every other day.   mometasone 50 MCG/ACT nasal spray Commonly  known as: NASONEX Place 2 sprays into the nose daily as needed.   sertraline 100 MG tablet Commonly known as: ZOLOFT Take 1.5 tablets (150 mg total) by mouth daily. What changed: how much to take Changed by: Kathlene November, MD          Objective:   Physical Exam BP 133/75 (BP Location: Left Arm, Patient Position: Sitting, Cuff Size: Normal)   Pulse 65   Temp 98.3 F (36.8 C) (Temporal)   Resp 16   Ht 6' (1.829 m)   Wt 203 lb 2 oz (92.1 kg)   SpO2 100%   BMI 27.55 kg/m  General:   Well developed, NAD, BMI noted. HEENT:  Normocephalic . Face symmetric, atraumatic Lower extremities: no pretibial edema bilaterally  Skin: Not pale. Not jaundice Neurologic:  alert & oriented X3.  Speech normal, gait appropriate for age and unassisted Psych--  Cognition and judgment appear intact.  Cooperative with normal attention span and concentration.  Behavior appropriate. Slightly apprehensive but not depressed appearing.      Assessment     Assessment OSA -initial sx: palpitations-fatigue--s/p uvulectomy, osa  much improved  Anxiety Allergies, bronchospasm Hernia L , saw surgery 2015, offered surgery Actinic keratosis, BCC (02/2020); Nevus  excisions 2015   PLAN Anxiety: Has been well controlled on sertraline 100 mg for years.  Symptoms are gradually resurfaced in the last 2  years, no depression at all. Triggers are unclear. We talked about options, he is open to counseling, information provided. Discussed change SSRI or increase sertraline, we agreed to increase sertraline to 150 mg. Reassess on return to the office. Palpitations: Saw cardiology, monitor showing rare PVCs and APCs and a very short run of SVT.  Echo 01/2020 was not completely normal,hey noted increased trabeculation at the apex of the LV, consider noncompaction, MRI was recommended. OSA: DX was years ago, improve after uvulectomy, mild snoring has resurfaced lately without fatigue, we agreed to  monitor the issue and  ESS at some point.  Occult OSA may cause mood changes. RTC 6 weeks CPX   This visit occurred during the SARS-CoV-2 public health emergency.  Safety protocols were in place, including screening questions prior to the visit, additional usage of staff PPE, and extensive cleaning of exam room while observing appropriate contact time as indicated for disinfecting solutions.

## 2020-03-23 DIAGNOSIS — J301 Allergic rhinitis due to pollen: Secondary | ICD-10-CM | POA: Diagnosis not present

## 2020-03-23 DIAGNOSIS — J3089 Other allergic rhinitis: Secondary | ICD-10-CM | POA: Diagnosis not present

## 2020-03-23 DIAGNOSIS — J3081 Allergic rhinitis due to animal (cat) (dog) hair and dander: Secondary | ICD-10-CM | POA: Diagnosis not present

## 2020-03-26 ENCOUNTER — Ambulatory Visit (INDEPENDENT_AMBULATORY_CARE_PROVIDER_SITE_OTHER): Payer: BC Managed Care – PPO | Admitting: Cardiology

## 2020-03-26 ENCOUNTER — Encounter: Payer: Self-pay | Admitting: Cardiology

## 2020-03-26 ENCOUNTER — Other Ambulatory Visit: Payer: Self-pay

## 2020-03-26 VITALS — BP 128/72 | HR 65 | Ht 72.0 in | Wt 206.0 lb

## 2020-03-26 DIAGNOSIS — I428 Other cardiomyopathies: Secondary | ICD-10-CM | POA: Diagnosis not present

## 2020-03-26 DIAGNOSIS — G4733 Obstructive sleep apnea (adult) (pediatric): Secondary | ICD-10-CM

## 2020-03-26 DIAGNOSIS — R0609 Other forms of dyspnea: Secondary | ICD-10-CM

## 2020-03-26 DIAGNOSIS — R0600 Dyspnea, unspecified: Secondary | ICD-10-CM | POA: Diagnosis not present

## 2020-03-26 DIAGNOSIS — R002 Palpitations: Secondary | ICD-10-CM

## 2020-03-26 HISTORY — DX: Other cardiomyopathies: I42.8

## 2020-03-26 NOTE — Progress Notes (Signed)
Cardiology Office Note:    Date:  03/26/2020   ID:  David Lowery, DOB 1971/01/23, MRN [id number]  PCP:  David Branch, MD  Cardiologist:  David Campus, MD    Referring MD: David Branch, MD   Chief Complaint  Patient presents with  . Follow-up    6 WK FU   Doing fine  History of Present Illness:    David Lowery is a 49 y.o. male who came to [place] because of symptoms of palpitations.  Evaluation included echocardiogram which showed possibility of noncompaction.  Since the time I seen him last time he seems to be doing fine denies having any significant palpitations no dizziness no passing out.  He does have only he is Hunt who died suddenly but she was a smoker as well as diabetic.  He tried to be active try to exercise on a regular basis.  He does have a sleep apnea that is managed.  Past Medical History:  Diagnosis Date  . Anxiety   . Asthma due to seasonal allergies   . OSA (obstructive sleep apnea)    initial sx: palpitations-fatigue--s/p surgery, much improved     Past Surgical History:  Procedure Laterality Date  . CHOLECYSTECTOMY N/A 12/06/2016   Procedure: LAPAROSCOPIC CHOLECYSTECTOMY WITH INTRAOPERATIVE CHOLANGIOGRAM;  Surgeon: David Messing III, MD;  Location: [facility];  Service: General;  Laterality: N/A;  . MASS EXCISION N/A 08/31/2017   Procedure: MINOR EXCISION SEBACEOUS CYST ON BACK X 2;  Surgeon: David Messing III, MD;  Location: [facility];  Service: General;  Laterality: N/A;  . THROAT SURGERY  2011   tonsilectomy, uvulectomy d/t OSA     Current Medications: Current Meds  Medication Sig  . albuterol (VENTOLIN HFA) 108 (90 Base) MCG/ACT inhaler Inhale 2 puffs into the lungs every 6 (six) hours as needed for wheezing or shortness of breath.  . fexofenadine (ALLEGRA) 180 MG tablet Take 180 mg by mouth every other day.   . levocetirizine (XYZAL) 5 MG tablet Take 5 mg by mouth every other day.  . mometasone (NASONEX) 50 MCG/ACT nasal spray Place 2 sprays  into the nose daily as needed.  . sertraline (ZOLOFT) 100 MG tablet Take 1.5 tablets (150 mg total) by mouth daily.     Allergies:   Sudafed [pseudoephedrine]   Social History   Socioeconomic History  . Marital status: Married    Spouse name: Not on file  . Number of children: 1  . Years of education: Not on file  . Highest education level: Not on file  Occupational History  . Occupation: Pharmacologist: Geneticist, molecular    Comment: [employer]  Tobacco Use  . Smoking status: Never Smoker  . Smokeless tobacco: Never Used  Substance and Sexual Activity  . Alcohol use: No    Comment: socially   . Drug use: No  . Sexual activity: Not on file  Other Topics Concern  . Not on file  Social History Narrative   Moved to [place] July 2014, from [employer]   Son born ~ 2012   Social Determinants of Health   Financial Resource Strain:   . Difficulty of Paying Living Expenses:   Food Insecurity:   . Worried About Charity fundraiser in the Last Year:   . Arboriculturist in the Last Year:   Transportation Needs:   . Film/video editor (Medical):   Marland Kitchen Lack of  Transportation (Non-Medical):   Physical Activity:   . Days of Exercise per Week:   . Minutes of Exercise per Session:   Stress:   . Feeling of Stress :   Social Connections:   . Frequency of Communication with Friends and Family:   . Frequency of Social Gatherings with Friends and Family:   . Attends Religious Services:   . Active Member of Clubs or Organizations:   . Attends Archivist Meetings:   Marland Kitchen Marital Status:      Family History: [name] family history includes David cancer in an other family member; Diabetes in an other family member; Hyperlipidemia in his brother. There is no history of CAD, Stroke, or Prostate cancer. ROS:   Please see the history of present illness.    All 14 point review of systems negative except as described per history of present  illness  EKGs/Labs/Other Studies Reviewed:      Recent Labs: 01/07/2020: ALT 37; BUN 15; Creatinine, Ser 0.77; Hemoglobin 15.0; Platelets 258.0; Potassium 4.0; Sodium 136; TSH 0.77  Recent Lipid Panel    Component Value Date/Time   CHOL 187 03/05/2018 1334   TRIG 253.0 (H) 03/05/2018 1334   HDL 37.70 (L) 03/05/2018 1334   CHOLHDL 5 03/05/2018 1334   VLDL 50.6 (H) 03/05/2018 1334   LDLCALC 129 (H) 12/03/2015 0937   LDLDIRECT 98.0 03/05/2018 1334    Physical Exam:    VS:  BP 128/72   Pulse 65   Ht 6' (1.829 m)   Wt 206 lb (93.4 kg)   SpO2 99%   BMI 27.94 kg/m     Wt Readings from Last 3 Encounters:  03/26/20 206 lb (93.4 kg)  03/15/20 203 lb 2 oz (92.1 kg)  02/09/20 205 lb (93 kg)     GEN:  Well nourished, well developed in no acute distress HEENT: Normal NECK: No JVD; No carotid bruits LYMPHATICS: No lymphadenopathy CARDIAC: RRR, no murmurs, no rubs, no gallops RESPIRATORY:  Clear to auscultation without rales, wheezing or rhonchi  ABDOMEN: Soft, non-tender, non-distended MUSCULOSKELETAL:  No edema; No deformity  SKIN: Warm and dry LOWER EXTREMITIES: no swelling NEUROLOGIC:  Alert and oriented x 3 PSYCHIATRIC:  Normal affect   ASSESSMENT:    1. OSA (obstructive sleep apnea)   2. Noncompaction cardiomyopathy (HCC)   3. Palpitations   4. Dyspnea on exertion    PLAN:    In order of problems listed above:  1. Possible noncompaction.  Awaiting MRI to confirm the diagnosis and also to start indications.  He did wear monitor likely monitor did not show any significant arrhythmia. 2. Palpitations: Monitor did not show any significant arrhythmia. 3. Dyspnea on exertion well ventricle ejection fraction was normal in function however does have possible different complexion.  Awaiting MRI to establish diagnosis. 4. Obstructive sleep apnea followed by internal medicine team.   Medication Adjustments/Labs and Tests Ordered: Current medicines are reviewed at length  with the patient today.  Concerns regarding medicines are outlined above.  No orders of the defined types were placed in this encounter.  Medication changes: No orders of the defined types were placed in this encounter.   Signed, David Liter, MD, [facility] 03/26/2020 8:35 AM    [place]

## 2020-03-26 NOTE — Patient Instructions (Signed)
Medication Instructions:  Your physician recommends that you continue on your current medications as directed. Please refer to the Current Medication list given to you today.  *If you need a refill on your cardiac medications before your next appointment, please call your pharmacy*   Lab Work: None If you have labs (blood work) drawn today and your tests are completely normal, you will receive your results only by: MyChart Message (if you have MyChart) OR A paper copy in the mail If you have any lab test that is abnormal or we need to change your treatment, we will call you to review the results.   Testing/Procedures: None   Follow-Up: At [facility], you and your health needs are our priority.  As part of our continuing mission to provide you with exceptional heart care, we have created designated Provider Care Teams.  These Care Teams include your primary Cardiologist (physician) and Advanced Practice Providers (APPs -  Physician Assistants and Nurse Practitioners) who all work together to provide you with the care you need, when you need it.  We recommend signing up for the patient portal called "MyChart".  Sign up information is provided on this After Visit Summary.  MyChart is used to connect with patients for Virtual Visits (Telemedicine).  Patients are able to view lab/test results, encounter notes, upcoming appointments, etc.  Non-urgent messages can be sent to your provider as well.   To learn more about what you can do with MyChart, go to [email or web address].    Your next appointment:   1 month(s)  The format for your next appointment:   In Person  Provider:   Robert Krasowski, MD   Other Instructions   

## 2020-04-06 DIAGNOSIS — J301 Allergic rhinitis due to pollen: Secondary | ICD-10-CM | POA: Diagnosis not present

## 2020-04-06 DIAGNOSIS — J3089 Other allergic rhinitis: Secondary | ICD-10-CM | POA: Diagnosis not present

## 2020-04-06 DIAGNOSIS — J3081 Allergic rhinitis due to animal (cat) (dog) hair and dander: Secondary | ICD-10-CM | POA: Diagnosis not present

## 2020-04-08 ENCOUNTER — Telehealth (HOSPITAL_COMMUNITY): Payer: Self-pay | Admitting: *Deleted

## 2020-04-08 NOTE — Telephone Encounter (Signed)
Reaching out to patient to offer assistance regarding upcoming cardiac imaging study; pt verbalizes understanding of appt date/time, parking situation and where to check in, and verified current allergies; name and call back number provided for further questions should they arise  Burley Saver RN [facility] [phone] office [phone] cell  Pt reports anxiety with MRI.  Reached out to ordering provider to see if any medication could be called in.

## 2020-04-12 ENCOUNTER — Ambulatory Visit (HOSPITAL_COMMUNITY)
Admission: RE | Admit: 2020-04-12 | Discharge: 2020-04-12 | Disposition: A | Discharge status: Home / Self Care | Payer: BC Managed Care – PPO | Source: Ambulatory Visit | Attending: Cardiology | Admitting: Cardiology

## 2020-04-12 ENCOUNTER — Other Ambulatory Visit: Payer: Self-pay

## 2020-04-12 DIAGNOSIS — Q249 Congenital malformation of heart, unspecified: Secondary | ICD-10-CM | POA: Insufficient documentation

## 2020-04-12 DIAGNOSIS — I428 Other cardiomyopathies: Secondary | ICD-10-CM | POA: Diagnosis not present

## 2020-04-12 HISTORY — PX: OTHER SURGICAL HISTORY: SHX169

## 2020-04-12 MED ORDER — GADOBUTROL 1 MMOL/ML IV SOLN
10.0000 mL | Freq: Once | INTRAVENOUS | Status: AC | PRN
  Administered 2020-04-12: 10 mL via INTRAVENOUS

## 2020-04-27 DIAGNOSIS — J301 Allergic rhinitis due to pollen: Secondary | ICD-10-CM | POA: Diagnosis not present

## 2020-04-27 DIAGNOSIS — J3089 Other allergic rhinitis: Secondary | ICD-10-CM | POA: Diagnosis not present

## 2020-04-27 DIAGNOSIS — J3081 Allergic rhinitis due to animal (cat) (dog) hair and dander: Secondary | ICD-10-CM | POA: Diagnosis not present

## 2020-04-30 ENCOUNTER — Encounter: Payer: Self-pay | Admitting: Cardiology

## 2020-04-30 ENCOUNTER — Other Ambulatory Visit: Payer: Self-pay

## 2020-04-30 ENCOUNTER — Ambulatory Visit (INDEPENDENT_AMBULATORY_CARE_PROVIDER_SITE_OTHER): Payer: BC Managed Care – PPO | Admitting: Cardiology

## 2020-04-30 VITALS — BP 136/82 | HR 69 | Ht 72.0 in | Wt 204.4 lb

## 2020-04-30 DIAGNOSIS — R0600 Dyspnea, unspecified: Secondary | ICD-10-CM | POA: Diagnosis not present

## 2020-04-30 DIAGNOSIS — I428 Other cardiomyopathies: Secondary | ICD-10-CM

## 2020-04-30 DIAGNOSIS — F419 Anxiety disorder, unspecified: Secondary | ICD-10-CM

## 2020-04-30 DIAGNOSIS — R002 Palpitations: Secondary | ICD-10-CM | POA: Diagnosis not present

## 2020-04-30 DIAGNOSIS — R0609 Other forms of dyspnea: Secondary | ICD-10-CM

## 2020-04-30 MED ORDER — METOPROLOL SUCCINATE ER 25 MG PO TB24: 25.0000 mg | ORAL_TABLET | Freq: Every day | ORAL | 1 refills | Status: DC

## 2020-04-30 MED ORDER — ASPIRIN EC 81 MG PO TBEC: 81.0000 mg | DELAYED_RELEASE_TABLET | Freq: Every day | ORAL | 3 refills | Status: AC

## 2020-04-30 NOTE — Addendum Note (Signed)
Addended by: Ashok Norris on: 04/30/2020 09:35 AM   Modules accepted: Orders

## 2020-04-30 NOTE — Progress Notes (Signed)
Cardiology Office Note:    Date:  04/30/2020   ID:  David Lowery, DOB 27-Mar-1971, MRN [id number]  PCP:  Colon Branch, MD  Cardiologist:  Jenne Campus, MD    Referring MD: Colon Branch, MD   No chief complaint on file. I am doing fine but still have some palpitations  History of Present Illness:    David Lowery is a 49 y.o. male he was sent to [place] because of palpitations.  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  He comes today to my office to discuss results of his MRI.  Overall he complained of having some palpitations he said he could lay down and he will feel his heart speeding up.  He did wear monitor and monitor showed only sinus tachycardia at the moment when he trigger events.  There was also short run of supraventricular tachycardia.  We talked in length about his condition I told him that can lead to arrhythmia as well as to sometimes thrombosis.  However at the typical happening somebody with diminished left ventricle ejection fraction.  I offered him to start baby aspirin every single day.  I will also give him small dose of beta-blocker metoprolol succinate 25 mg to help with his symptoms.  I will talk to Dr. Feliberto Harts asking for advice in terms of future referral for him.  Past Medical History:  Diagnosis Date  . Anxiety   . Asthma due to seasonal allergies   . OSA (obstructive sleep apnea)    initial sx: palpitations-fatigue--s/p surgery, much improved     Past Surgical History:  Procedure Laterality Date  . CHOLECYSTECTOMY N/A 12/06/2016   Procedure: LAPAROSCOPIC CHOLECYSTECTOMY WITH INTRAOPERATIVE CHOLANGIOGRAM;  Surgeon: Autumn Messing III, MD;  Location: [facility];  Service: General;  Laterality: N/A;  . MASS EXCISION N/A 08/31/2017   Procedure: MINOR EXCISION SEBACEOUS CYST ON BACK X 2;  Surgeon: Autumn Messing III, MD;  Location:  [facility];  Service: General;  Laterality: N/A;  . THROAT SURGERY  2011   tonsilectomy, uvulectomy d/t OSA     Current Medications: Current Meds  Medication Sig  . albuterol (VENTOLIN HFA) 108 (90 Base) MCG/ACT inhaler Inhale 2 puffs into the lungs every 6 (six) hours as needed for wheezing or shortness of breath.  . fexofenadine (ALLEGRA) 180 MG tablet Take 180 mg by mouth every other day.   . levocetirizine (XYZAL) 5 MG tablet Take 5 mg by mouth every other day.  . mometasone (NASONEX) 50 MCG/ACT nasal spray Place 2 sprays into the nose daily as needed.  . sertraline (ZOLOFT) 100 MG tablet Take 1.5 tablets (150 mg total) by mouth daily.     Allergies:   Sudafed [pseudoephedrine]   Social History   Socioeconomic History  . Marital status: Married    Spouse name: Not on file  . Number of children: 1  . Years of education: Not on file  . Highest education level: Not on file  Occupational History  . Occupation: Pharmacologist: Geneticist, molecular    Comment: [employer]  Tobacco Use  . Smoking status: Never Smoker  . Smokeless tobacco: Never Used  Substance and Sexual Activity  . Alcohol use: No    Comment: socially   . Drug use: No  . Sexual activity: Not on file  Other Topics Concern  .  Not on file  Social History Narrative   Moved to [facility] July 2014, from [employer]   Son born ~ 2012   Social Determinants of Health   Financial Resource Strain:   . Difficulty of Paying Living Expenses:   Food Insecurity:   . Worried About Charity fundraiser in the Last Year:   . Arboriculturist in the Last Year:   Transportation Needs:   . Film/video editor (Medical):   Marland Kitchen Lack of Transportation (Non-Medical):   Physical Activity:   . Days of Exercise per Week:   . Minutes of Exercise per Session:   Stress:   . Feeling of Stress :   Social Connections:   . Frequency of Communication with Friends and Family:   . Frequency of Social  Gatherings with Friends and Family:   . Attends Religious Services:   . Active Member of Clubs or Organizations:   . Attends Archivist Meetings:   Marland Kitchen Marital Status:      Family History: [name] family history includes Colon cancer in an other family member; Diabetes in an other family member; Hyperlipidemia in his brother. There is no history of CAD, Stroke, or Prostate cancer. ROS:   Please see the history of present illness.    All 14 point review of systems negative except as described per history of present illness  EKGs/Labs/Other Studies Reviewed:      Recent Labs: 01/07/2020: ALT 37; BUN 15; Creatinine, Ser 0.77; Hemoglobin 15.0; Platelets 258.0; Potassium 4.0; Sodium 136; TSH 0.77  Recent Lipid Panel    Component Value Date/Time   CHOL 187 03/05/2018 1334   TRIG 253.0 (H) 03/05/2018 1334   HDL 37.70 (L) 03/05/2018 1334   CHOLHDL 5 03/05/2018 1334   VLDL 50.6 (H) 03/05/2018 1334   LDLCALC 129 (H) 12/03/2015 0937   LDLDIRECT 98.0 03/05/2018 1334    Physical Exam:    VS:  BP 136/82 (BP Location: Right Arm, Patient Position: Sitting, Cuff Size: Normal)   Pulse 69   Ht 6' (1.829 m)   Wt 204 lb 6.4 oz (92.7 kg)   SpO2 99%   BMI 27.72 kg/m     Wt Readings from Last 3 Encounters:  04/30/20 204 lb 6.4 oz (92.7 kg)  03/26/20 206 lb (93.4 kg)  03/15/20 203 lb 2 oz (92.1 kg)     GEN:  Well nourished, well developed in no acute distress HEENT: Normal NECK: No JVD; No carotid bruits LYMPHATICS: No lymphadenopathy CARDIAC: RRR, no murmurs, no rubs, no gallops RESPIRATORY:  Clear to auscultation without rales, wheezing or rhonchi  ABDOMEN: Soft, non-tender, non-distended MUSCULOSKELETAL:  No edema; No deformity  SKIN: Warm and dry LOWER EXTREMITIES: no swelling NEUROLOGIC:  Alert and oriented x 3 PSYCHIATRIC:  Normal affect   ASSESSMENT:    1. Noncompaction cardiomyopathy (HCC)   2. Palpitations   3. Dyspnea on exertion   4. Anxiety    PLAN:     In order of problems listed above:  1. 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  He does have family history of sudden cardiac death which happened to his aunt however she was diabetic and recognize as well as heavy smoker.  Overall he does not have any dizziness but does have some palpitation monitor shows some sinus tachycardia.  We will try to give him small dose of beta-blocker to see if that helps. 2. Palpitation plan as outlined above. 3. Dyspnea on exertion  only mild preserved ejection fraction. 4. Anxiety obesity problem especially with his diagnosis of noncompaction he reads a lot on the Internet which advised against.  However his sister who is in medical field helping him to go 2 days and she is kind of encouraging him that he will be all right.  Plan will be refer him to our cardiomyopathy clinic.   Medication Adjustments/Labs and Tests Ordered: Current medicines are reviewed at length with the patient today.  Concerns regarding medicines are outlined above.  No orders of the defined types were placed in this encounter.  Medication changes: No orders of the defined types were placed in this encounter.   Signed, Park Liter, MD, [facility] 04/30/2020 9:00 AM    [place]

## 2020-04-30 NOTE — Patient Instructions (Signed)
Medication Instructions:  Your physician has recommended you make the following change in your medication:   START: Metoprolol succinate 25 mg daily   START: Aspirin 81 mg daily   *If you need a refill on your cardiac medications before your next appointment, please call your pharmacy*   Lab Work: None.  If you have labs (blood work) drawn today and your tests are completely normal, you will receive your results only by: Marland Kitchen MyChart Message (if you have MyChart) OR . A paper copy in the mail If you have any lab test that is abnormal or we need to change your treatment, we will call you to review the results.   Testing/Procedures: None.    Follow-Up: At [facility], you and your health needs are our priority.  As part of our continuing mission to provide you with exceptional heart care, we have created designated Provider Care Teams.  These Care Teams include your primary Cardiologist (physician) and Advanced Practice Providers (APPs -  Physician Assistants and Nurse Practitioners) who all work together to provide you with the care you need, when you need it.  We recommend signing up for the patient portal called "MyChart".  Sign up information is provided on this After Visit Summary.  MyChart is used to connect with patients for Virtual Visits (Telemedicine).  Patients are able to view lab/test results, encounter notes, upcoming appointments, etc.  Non-urgent messages can be sent to your provider as well.   To learn more about what you can do with MyChart, go to [email or web address].    Your next appointment:   3 month(s)  The format for your next appointment:   In Person  Provider:   Jenne Campus, MD   Other Instructions   Aspirin and Your Heart  Aspirin is a medicine that prevents the cells in the blood that are used for clotting, called platelets, from sticking together. Aspirin can be used to help reduce the risk of blood clots, heart attacks, and other  heart-related problems. Can I take aspirin? Your health care provider will help you determine whether it is safe and beneficial for you to take aspirin daily. Taking aspirin daily may be helpful if you:  Have had a heart attack or chest pain.  Are at risk for a heart attack.  Have undergone open-heart surgery, such as coronary artery bypass surgery (CABG).  Have had coronary angioplasty or a stent.  Have had certain types of stroke or transient ischemic attack (TIA).  Have peripheral artery disease (PAD).  Have chronic heart rhythm problems such as atrial fibrillation and cannot take an anticoagulant.  Have valve disease or have had surgery on a valve. What are the risks? Daily use of aspirin can cause side effects. Some of these include:  Bleeding. Bleeding problems can be minor or serious. An example of a minor problem is a cut that does not stop bleeding. An example of a more serious problem is stomach bleeding or, rarely, bleeding into the brain. Your risk of bleeding is increased if you are also taking non-steroidal anti-inflammatory drugs (NSAIDs).  Increased bruising.  Upset stomach.  An allergic reaction. People who have nasal polyps have an increased risk of developing an aspirin allergy. General guidelines  Take aspirin only as told by your health care provider. Make sure that you understand how much you should take and what form you should take. The two forms of aspirin are: ? Non-enteric-coated.This type of aspirin does not have a coating and is absorbed quickly.  This type of aspirin also comes in a chewable form. ? Enteric-coated. This type of aspirin has a coating that releases the medicine very slowly. Enteric-coated aspirin might cause less stomach upset than non-enteric-coated aspirin. This type of aspirin should not be chewed or crushed.  Limit alcohol intake to no more than 1 drink a day for nonpregnant women and 2 drinks a day for men. Drinking alcohol increases  your risk of bleeding. One drink equals 12 oz of beer, 5 oz of wine, or 1 oz of hard liquor. Contact a health care provider if you:  Have unusual bleeding or bruising.  Have stomach pain or nausea.  Have ringing in your ears.  Have an allergic reaction that causes: ? Hives. ? Itchy skin. ? Swelling of the lips, tongue, or face. Get help right away if you:  Notice that your bowel movements are bloody, dark red, or black in color.  Vomit or cough up blood.  Have blood in your urine.  Cough, have noisy breathing (wheeze), or feel short of breath.  Have chest pain, especially if the pain spreads to the arms, back, neck, or jaw.  Have a severe headache, or a headache with confusion, or dizziness. These symptoms may represent a serious problem that is an emergency. Do not wait to see if the symptoms will go away. Get medical help right away. Call your local emergency services (911 in the [place] Do not drive yourself to the hospital. Summary  Aspirin can be used to help reduce the risk of blood clots, heart attacks, and other heart-related problems.  Daily use of aspirin can increase your risk of side effects. Your health care provider will help you determine whether it is safe and beneficial for you to take aspirin daily.  Take aspirin only as told by your health care provider. Make sure that you understand how much you can take and what form you can take. This information is not intended to replace advice given to you by your health care provider. Make sure you discuss any questions you have with your health care provider. Document Revised: 08/09/2017 Document Reviewed: 08/09/2017 Elsevier Patient Education  2020 [employer].

## 2020-05-07 NOTE — Addendum Note (Signed)
Addended by: Ashok Norris on: 05/07/2020 05:24 PM   Modules accepted: Orders

## 2020-05-26 ENCOUNTER — Encounter: Payer: Self-pay | Admitting: Internal Medicine

## 2020-05-26 ENCOUNTER — Other Ambulatory Visit: Payer: Self-pay

## 2020-05-26 ENCOUNTER — Ambulatory Visit (INDEPENDENT_AMBULATORY_CARE_PROVIDER_SITE_OTHER): Payer: BC Managed Care – PPO | Admitting: Internal Medicine

## 2020-05-26 VITALS — BP 150/98 | HR 90 | Temp 98.0°F | Resp 18 | Ht 72.0 in | Wt 199.5 lb

## 2020-05-26 DIAGNOSIS — F419 Anxiety disorder, unspecified: Secondary | ICD-10-CM

## 2020-05-26 DIAGNOSIS — I428 Other cardiomyopathies: Secondary | ICD-10-CM | POA: Diagnosis not present

## 2020-05-26 DIAGNOSIS — R002 Palpitations: Secondary | ICD-10-CM

## 2020-05-26 MED ORDER — ESCITALOPRAM OXALATE 20 MG PO TABS: 20.0000 mg | ORAL_TABLET | Freq: Every day | ORAL | 3 refills | Status: DC

## 2020-05-26 NOTE — Patient Instructions (Signed)
Stop taking sertraline  Start escitalopram 20 mg: 1 tablet daily     GO TO THE FRONT DESK, PLEASE SCHEDULE YOUR APPOINTMENTS Come back for a check up in 3 months

## 2020-05-26 NOTE — Progress Notes (Signed)
Pre visit review using our clinic review tool, if applicable. No additional management support is needed unless otherwise documented below in the visit note. 

## 2020-05-26 NOTE — Progress Notes (Signed)
Subjective:    Patient ID: David Lowery, male    DOB: February 03, 1971, 49 y.o.   MRN: [id number]  DOS:  05/26/2020 Type of visit - description: Follow-up Since last visit saw cardiology, notes reviewed.  Continue with palpitations: On and off, day or night, sometimes awakened by palpitations. He track his heart rate with his watch and the palpitations are associated with a heart rate in the 110, 120s. No associated sweats (except for 1 or 2 times), no nausea or chest pain.  Anxiety: Ongoing  Review of Systems No depression per se.  Past Medical History:  Diagnosis Date  . Anxiety   . Asthma due to seasonal allergies   . OSA (obstructive sleep apnea)    initial sx: palpitations-fatigue--s/p surgery, much improved     Past Surgical History:  Procedure Laterality Date  . CHOLECYSTECTOMY N/A 12/06/2016   Procedure: LAPAROSCOPIC CHOLECYSTECTOMY WITH INTRAOPERATIVE CHOLANGIOGRAM;  Surgeon: Autumn Messing III, MD;  Location: [facility];  Service: General;  Laterality: N/A;  . MASS EXCISION N/A 08/31/2017   Procedure: MINOR EXCISION SEBACEOUS CYST ON BACK X 2;  Surgeon: Jovita Kussmaul, MD;  Location: [facility];  Service: General;  Laterality: N/A;  . THROAT SURGERY  2011   tonsilectomy, uvulectomy d/t OSA     Allergies as of 05/26/2020      Reactions   Sudafed [pseudoephedrine] Anxiety, Palpitations      Medication List       Accurate as of May 26, 2020 11:59 PM. If you have any questions, ask your nurse or doctor.        STOP taking these medications   sertraline 100 MG tablet Commonly known as: ZOLOFT Stopped by: Kathlene November, MD     TAKE these medications   albuterol 108 (90 Base) MCG/ACT inhaler Commonly known as: Ventolin HFA Inhale 2 puffs into the lungs every 6 (six) hours as needed for wheezing or shortness of breath.   aspirin EC 81 MG tablet Take 1 tablet (81 mg total) by mouth daily. Swallow whole.   escitalopram 20 MG tablet Commonly known as:  LEXAPRO Take 1 tablet (20 mg total) by mouth daily. Started by: Kathlene November, MD   fexofenadine 180 MG tablet Commonly known as: ALLEGRA Take 180 mg by mouth every other day.   levocetirizine 5 MG tablet Commonly known as: XYZAL Take 5 mg by mouth every other day.   metoprolol succinate 25 MG 24 hr tablet Commonly known as: TOPROL-XL Take 1 tablet (25 mg total) by mouth daily. Take with or immediately following a meal.   mometasone 50 MCG/ACT nasal spray Commonly known as: NASONEX Place 2 sprays into the nose daily as needed.          Objective:   Physical Exam BP (!) 150/98 (BP Location: Right Arm, Patient Position: Sitting, Cuff Size: Normal)   Pulse 90   Temp 98 F (36.7 C) (Oral)   Resp 18   Ht 6' (1.829 m)   Wt 199 lb 8 oz (90.5 kg)   SpO2 97%   BMI 27.06 kg/m  General:   Well developed, NAD, BMI noted. HEENT:  Normocephalic . Face symmetric, atraumatic Heart rate: Regular. Skin: Not pale. Not jaundice Neurologic:  alert & oriented X3.  Speech normal, gait appropriate for age and unassisted Psych--  Cognition and judgment appear intact.  Cooperative with normal attention span and concentration.  Behavior appropriate. No anxious or depressed appearing.      Assessment  Assessment OSA -initial sx: palpitations-fatigue--s/p uvulectomy, osa  much improved  Anxiety Allergies, bronchospasm Hernia L , saw surgery 2015, offered surgery Actinic keratosis, BCC (02/2020); Nevus  excisions 2015 CV: Palpitations: Monitor short run of SVT, MRI heart: Noncompaction  PLAN  Palpitations, cardiac noncompaction: Saw cardiology, notes reviewed, at the last visit they Rx beta-blockers and aspirin.  He continues to be symptomatic, having palpitations day or night.  Will send a message to cardiology, increase beta-blockers? Elevated BP: BP today 150/98, first time BP is elevated. Anxiety: Symptoms are not well controlled even after we increase sertraline to 150 mg  daily.  Unclear if palpitations are increasing anxiety or vice versa. At this point we agreed to change sertraline 150 mg to Lexapro 20 mg daily, reassess in 3 months. He is still working on Retail banker. RTC 3 months    This visit occurred during the SARS-CoV-2 public health emergency.  Safety protocols were in place, including screening questions prior to the visit, additional usage of staff PPE, and extensive cleaning of exam room while observing appropriate contact time as indicated for disinfecting solutions.    This visit occurred during the SARS-CoV-2 public health emergency.  Safety protocols were in place, including screening questions prior to the visit, additional usage of staff PPE, and extensive cleaning of exam room while observing appropriate contact time as indicated for disinfecting solutions.

## 2020-05-27 ENCOUNTER — Encounter: Payer: Self-pay | Admitting: Internal Medicine

## 2020-05-27 DIAGNOSIS — G4733 Obstructive sleep apnea (adult) (pediatric): Secondary | ICD-10-CM

## 2020-05-27 NOTE — Assessment & Plan Note (Signed)
Palpitations, cardiac noncompaction: Saw cardiology, notes reviewed, at the last visit they Rx beta-blockers and aspirin.  He continues to be symptomatic, having palpitations day or night.  Will send a message to cardiology, increase beta-blockers? Elevated BP: BP today 150/98, first time BP is elevated. Anxiety: Symptoms are not well controlled even after we increase sertraline to 150 mg daily.  Unclear if palpitations are increasing anxiety or vice versa. At this point we agreed to change sertraline 150 mg to Lexapro 20 mg daily, reassess in 3 months. He is still working on Retail banker. RTC 3 months

## 2020-05-31 DIAGNOSIS — J3081 Allergic rhinitis due to animal (cat) (dog) hair and dander: Secondary | ICD-10-CM | POA: Diagnosis not present

## 2020-05-31 DIAGNOSIS — J301 Allergic rhinitis due to pollen: Secondary | ICD-10-CM | POA: Diagnosis not present

## 2020-05-31 DIAGNOSIS — J3089 Other allergic rhinitis: Secondary | ICD-10-CM | POA: Diagnosis not present

## 2020-06-01 ENCOUNTER — Telehealth: Payer: Self-pay | Admitting: Emergency Medicine

## 2020-06-01 DIAGNOSIS — I428 Other cardiomyopathies: Secondary | ICD-10-CM

## 2020-06-01 NOTE — Telephone Encounter (Signed)
Left message for patient to return call regarding increasing medication per Dr. Agustin Cree.

## 2020-06-04 DIAGNOSIS — C44311 Basal cell carcinoma of skin of nose: Secondary | ICD-10-CM | POA: Diagnosis not present

## 2020-06-04 DIAGNOSIS — C44319 Basal cell carcinoma of skin of other parts of face: Secondary | ICD-10-CM | POA: Diagnosis not present

## 2020-06-04 DIAGNOSIS — L570 Actinic keratosis: Secondary | ICD-10-CM | POA: Diagnosis not present

## 2020-06-14 DIAGNOSIS — C44311 Basal cell carcinoma of skin of nose: Secondary | ICD-10-CM | POA: Diagnosis not present

## 2020-06-22 ENCOUNTER — Other Ambulatory Visit: Payer: Self-pay | Admitting: Internal Medicine

## 2020-06-23 NOTE — Telephone Encounter (Signed)
Left message for patient to return call.

## 2020-06-29 DIAGNOSIS — J3089 Other allergic rhinitis: Secondary | ICD-10-CM | POA: Diagnosis not present

## 2020-06-29 DIAGNOSIS — J301 Allergic rhinitis due to pollen: Secondary | ICD-10-CM | POA: Diagnosis not present

## 2020-06-29 DIAGNOSIS — J3081 Allergic rhinitis due to animal (cat) (dog) hair and dander: Secondary | ICD-10-CM | POA: Diagnosis not present

## 2020-06-29 NOTE — Addendum Note (Signed)
Addended by: Ashok Norris on: 06/29/2020 04:44 PM   Modules accepted: Orders

## 2020-07-01 ENCOUNTER — Ambulatory Visit: Payer: BC Managed Care – PPO | Admitting: Genetic Counselor

## 2020-07-01 ENCOUNTER — Other Ambulatory Visit: Payer: Self-pay

## 2020-07-05 ENCOUNTER — Ambulatory Visit (INDEPENDENT_AMBULATORY_CARE_PROVIDER_SITE_OTHER): Payer: BC Managed Care – PPO | Admitting: Neurology

## 2020-07-05 ENCOUNTER — Encounter: Payer: Self-pay | Admitting: Neurology

## 2020-07-05 ENCOUNTER — Other Ambulatory Visit: Payer: Self-pay

## 2020-07-05 VITALS — BP 124/64 | HR 61 | Ht 72.0 in | Wt 206.0 lb

## 2020-07-05 DIAGNOSIS — E663 Overweight: Secondary | ICD-10-CM | POA: Diagnosis not present

## 2020-07-05 DIAGNOSIS — G4733 Obstructive sleep apnea (adult) (pediatric): Secondary | ICD-10-CM

## 2020-07-05 DIAGNOSIS — R0683 Snoring: Secondary | ICD-10-CM | POA: Diagnosis not present

## 2020-07-05 DIAGNOSIS — R002 Palpitations: Secondary | ICD-10-CM | POA: Diagnosis not present

## 2020-07-05 DIAGNOSIS — Z9889 Other specified postprocedural states: Secondary | ICD-10-CM

## 2020-07-05 DIAGNOSIS — I471 Supraventricular tachycardia: Secondary | ICD-10-CM

## 2020-07-05 NOTE — Patient Instructions (Addendum)
Thank you for choosing [facility] for your sleep related care! It was nice to meet you today! I appreciate that you entrust me with your sleep related healthcare concerns. I hope, I was able to address at least some of your concerns today, and that I can help you feel reassured and also get better.    Here is what we discussed today and what we came up with as our plan for you:    Based on your symptoms and your exam I believe you are at some risk for obstructive sleep apnea (aka OSA), which may be the cause of your nighttime palpitations (rapid heart beat). I think we should proceed with a sleep study to determine whether you do or do not have OSA and how severe it is. Even, if you have mild OSA, I may want you to consider treatment with CPAP, as treatment of even borderline or mild sleep apnea can result and improvement of symptoms such as sleep disruption, daytime sleepiness, nighttime bathroom breaks, restless leg symptoms, improvement of headache syndromes, even improved mood disorder.   As explained, an attended sleep study meaning you get to stay overnight in the sleep lab, lets [place] monitor sleep-related behaviors such as sleep talking and leg movements in sleep, in addition to monitoring for sleep apnea.  A home sleep test is a screening tool for sleep apnea only, and unfortunately does not help with any other sleep-related diagnoses.  Please remember, the long-term risks and ramifications of untreated moderate to severe obstructive sleep apnea are: increased Cardiovascular disease, including congestive heart failure, stroke, difficult to control hypertension, treatment resistant obesity, arrhythmias, especially irregular heartbeat commonly known as A. Fib. (atrial fibrillation); even type 2 diabetes has been linked to untreated OSA.   Sleep apnea can cause disruption of sleep and sleep deprivation in most cases, which, in turn, can cause recurrent headaches, problems with  memory, mood, concentration, focus, and vigilance. Most people with untreated sleep apnea report excessive daytime sleepiness, which can affect their ability to drive. Please do not drive if you feel sleepy. Patients with sleep apnea can also develop difficulty initiating and maintaining sleep (aka insomnia).   Having sleep apnea may increase your risk for other sleep disorders, including involuntary behaviors sleep such as sleep terrors, sleep talking, sleepwalking.    Having sleep apnea can also increase your risk for restless leg syndrome and leg movements at night.   Please note that untreated obstructive sleep apnea may carry additional perioperative morbidity. Patients with significant obstructive sleep apnea (typically, in the moderate to severe degree) should receive, if possible, perioperative PAP (positive airway pressure) therapy and the surgeons and particularly the anesthesiologists should be informed of the diagnosis and the severity of the sleep disordered breathing.   I will likely see you back after your sleep study to go over the test results and where to go from there. We will call you after your sleep study to advise about the results (most likely, you will hear from [facility], my nurse) and to set up an appointment at the time, as necessary.    Our sleep lab administrative assistant will call you to schedule your sleep study and give you further instructions, regarding the check in process for the sleep study, arrival time, what to bring, when you can expect to leave after the study, etc., and to answer any other logistical questions you may have. If you don't hear back from her by about 2 weeks from now, please feel  free to call her direct line at [phone] or you can call our general clinic number, or email [place] through My Chart.

## 2020-07-05 NOTE — Progress Notes (Signed)
Subjective:    Patient ID: ZAMERE PASTERNAK is a 49 y.o. male.  HPI     Star Age, MD, PhD [facility] [address] [address] [place], [place] [address]  Dear Dr. Larose Kells,   I saw your patient, Kunal Levario, upon your kind request, in my sleep clinic today for Initial consultation of his sleep disorder, in particular, reevaluation of his sleep apnea in the context of recent nighttime or early morning palpitations.  The patient is unaccompanied today.  As you know, Mr. Bergen is a very pleasant 49 year old right-handed gentleman with an underlying medical history of asthma, allergies, anxiety, palpitations and mildly overweight state, who was previously diagnosed with obstructive sleep apnea several years ago.  Sleep study testing was in 2006 or thereabouts, prior sleep study results are not available for my review today.  He had UPPP in 2006 or 2007 after which his snoring greatly improved.  He has started having some recurrence of snoring the past year.  Weight has generally been stable.  In the past 6 months he has woken up infrequently with palpitations.  He denies any chest pain or shortness of breath or acid reflux symptoms or gasping sensations at night but has woken up with a sense of rapid heartbeat, he has retracted with his smart phone and it was in the 120s when he generally is in the 60s while asleep. Some 6 months ago his palpitations happen maybe once a month and now it is more frequent in the past few weeks and few months.  He denies any recurrent morning headaches or night to night nocturia or family history of sleep apnea.  He does have a strong family history of anxiety.  He has been on Lexapro with success.  His asthma is under good control and he gets allergy shots and takes an antihistamine on a regular basis since moving to [place] from [place].  When he was on CPAP in the past he had trouble tolerating the full facemask.  He is a mouth  breather and likes to sleep on his stomach or sides and had difficulty tolerating the CPAP mask.  His Epworth sleepiness score is 6 out of 24, fatigue severity score is 21 out of 63.  He has seen cardiology.  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  He works second shift.  Bedtime is generally around 1 AM and his typical rise time is 8 AM.  He works Tuesdays through Saturdays.  He lives with his wife and 63-year-old daughter.  They have no pets in the house.  He has no TV in the bedroom.  He drinks caffeine in the form of soda, 1 or 2/day on average.  He drinks alcohol rarely to none.  He is a non-smoker.   His Past Medical History Is Significant For: Past Medical History:  Diagnosis Date  . Anxiety   . Asthma due to seasonal allergies   . OSA (obstructive sleep apnea)    initial sx: palpitations-fatigue--s/p surgery, much improved     His Past Surgical History Is Significant For: Past Surgical History:  Procedure Laterality Date  . CHOLECYSTECTOMY N/A 12/06/2016   Procedure: LAPAROSCOPIC CHOLECYSTECTOMY WITH INTRAOPERATIVE CHOLANGIOGRAM;  Surgeon: Autumn Messing III, MD;  Location: [place];  Service: General;  Laterality: N/A;  . MASS EXCISION N/A 08/31/2017   Procedure: MINOR EXCISION SEBACEOUS CYST ON BACK X 2;  Surgeon: Jovita Kussmaul, MD;  Location: [place];  Service: General;  Laterality: N/A;  . THROAT SURGERY  2011   tonsilectomy, uvulectomy d/t OSA     His Family History Is Significant For: Family History  Problem Relation Age of Onset  . Hyperlipidemia Brother        bro, mother, GM  . Colon cancer Other        GM at age 90  . Diabetes Other        aunt  . CAD Neg Hx   . Stroke Neg Hx   . Prostate cancer Neg Hx     His Social History Is Significant For: Social History   Socioeconomic History  . Marital status: Married    Spouse name: Not on file  . Number of children: 1  . Years of education: Not on file  . Highest  education level: Not on file  Occupational History  . Occupation: Pharmacologist: Geneticist, molecular    Comment: [employer]  Tobacco Use  . Smoking status: Never Smoker  . Smokeless tobacco: Never Used  Substance and Sexual Activity  . Alcohol use: Yes    Comment: Less than 2 drinks per month   . Drug use: No  . Sexual activity: Not on file  Other Topics Concern  . Not on file  Social History Narrative   Moved to [facility] July 2014, from [employer]   Son born ~ 2012   Social Determinants of Health   Financial Resource Strain:   . Difficulty of Paying Living Expenses: Not on file  Food Insecurity:   . Worried About Charity fundraiser in the Last Year: Not on file  . Ran Out of Food in the Last Year: Not on file  Transportation Needs:   . Lack of Transportation (Medical): Not on file  . Lack of Transportation (Non-Medical): Not on file  Physical Activity:   . Days of Exercise per Week: Not on file  . Minutes of Exercise per Session: Not on file  Stress:   . Feeling of Stress : Not on file  Social Connections:   . Frequency of Communication with Friends and Family: Not on file  . Frequency of Social Gatherings with Friends and Family: Not on file  . Attends Religious Services: Not on file  . Active Member of Clubs or Organizations: Not on file  . Attends Archivist Meetings: Not on file  . Marital Status: Not on file    His Allergies Are:  Allergies  Allergen Reactions  . Sudafed [Pseudoephedrine] Anxiety and Palpitations  :   His Current Medications Are:  Outpatient Encounter Medications as of 07/05/2020  Medication Sig  . albuterol (VENTOLIN HFA) 108 (90 Base) MCG/ACT inhaler Inhale 2 puffs into the lungs every 6 (six) hours as needed for wheezing or shortness of breath.  Marland Kitchen aspirin EC 81 MG tablet Take 1 tablet (81 mg total) by mouth daily. Swallow whole.  . escitalopram (LEXAPRO) 20 MG tablet Take 1 tablet (20 mg total) by mouth  daily.  . fexofenadine (ALLEGRA) 180 MG tablet Take 180 mg by mouth every other day.   . levocetirizine (XYZAL) 5 MG tablet Take 5 mg by mouth every other day.  . metoprolol succinate (TOPROL-XL) 25 MG 24 hr tablet Take 1 tablet (25 mg total) by mouth daily. Take with or immediately following a meal.  . mometasone (NASONEX) 50 MCG/ACT nasal spray Place 2 sprays into the nose daily as needed.   No  facility-administered encounter medications on file as of 07/05/2020.  :  Review of Systems:  Out of a complete 14 point review of systems, all are reviewed and negative with the exception of these symptoms as listed below: Review of Systems  Neurological:       Here for sleep consult. 2 prior sleep studies. OSA was originally found. Pt tired cpap but had UV3 surgery in 2006 and has been of cpap since. He also reports an increase in his hear rate that will wake him from sleeping.   Epworth Sleepiness Scale 0= would never doze 1= slight chance of dozing 2= moderate chance of dozing 3= high chance of dozing  Sitting and reading:2 Watching TV:1 Sitting inactive in a public place (ex. Theater or meeting):0 As a passenger in a car for an hour without a break:1 Lying down to rest in the afternoon:2 Sitting and talking to someone:0 Sitting quietly after lunch (no alcohol):0 In a car, while stopped in traffic:0 Total:6     Objective:  Neurological Exam  Physical Exam Physical Examination:   Vitals:   07/05/20 0858  BP: 124/64  Pulse: 61  SpO2: 98%   General Examination: The patient is a very pleasant 49 y.o. male in no acute distress. He appears well-developed and well-nourished and well groomed.   HEENT: Normocephalic, atraumatic, pupils are equal, round and reactive to light, extraocular tracking is good without limitation to gaze excursion or nystagmus noted. Hearing is grossly intact. Face is symmetric with normal facial animation. Speech is clear with no dysarthria noted. There is  no hypophonia. There is no lip, neck/head, jaw or voice tremor. Neck is supple with full range of passive and active motion. There are no carotid bruits on auscultation. Oropharynx exam reveals: mild mouth dryness, good dental hygiene and moderate airway crowding, due to small airway with scar tissue noted around the soft palate edge.  Status post UPPP, tonsils absent.  Tongue protrudes centrally and palate elevates symmetrically.  Neck circumference of 16-1/2 inches.  He has a mild overbite.  Chest: Clear to auscultation without wheezing, rhonchi or crackles noted.  Heart: S1+S2+0, regular and normal without murmurs, rubs or gallops noted.   Abdomen: Soft, non-tender and non-distended with normal bowel sounds appreciated on auscultation.  Extremities: There is no pitting edema in the distal lower extremities bilaterally.   Skin: Warm and dry without trophic changes noted.   Musculoskeletal: exam reveals no obvious joint deformities, tenderness or joint swelling or erythema.   Neurologically:  Mental status: The patient is awake, alert and oriented in all 4 spheres. His immediate and remote memory, attention, language skills and fund of knowledge are appropriate. There is no evidence of aphasia, agnosia, apraxia or anomia. Speech is clear with normal prosody and enunciation. Thought process is linear. Mood is normal and affect is normal.  Cranial nerves II - XII are as described above under HEENT exam.  Motor exam: Normal bulk, strength and tone is noted. There is no tremor, Romberg is negative. Fine motor skills and coordination: grossly intact.  Cerebellar testing: No dysmetria or intention tremor. There is no truncal or gait ataxia.  Sensory exam: intact to light touch in the upper and lower extremities.  Gait, station and balance: He stands easily. No veering to one side is noted. No leaning to one side is noted. Posture is age-appropriate and stance is narrow based. Gait shows normal stride  length and normal pace. No problems turning are noted. Tandem walk is unremarkable.  Assessment and Plan:  In summary, STANELY SEXSON is a very pleasant 49 y.o.-year old male with an underlying medical history of asthma, allergies, anxiety, palpitations and mildly overweight state, whose history and physical exam are concerning for obstructive sleep apnea (OSA). I had a long chat with the patient about my findings and the diagnosis of OSA, its prognosis and treatment options. We talked about medical treatments, surgical interventions and non-pharmacological approaches. I explained in particular the risks and ramifications of untreated moderate to severe OSA, especially with respect to developing cardiovascular disease down the Road, including congestive heart failure, difficult to treat hypertension, cardiac arrhythmias, or stroke. Even type 2 diabetes has, in part, been linked to untreated OSA. Symptoms of untreated OSA include daytime sleepiness, memory problems, mood irritability and mood disorder such as depression and anxiety, lack of energy, as well as recurrent headaches, especially morning headaches. We talked about trying to maintain a healthy lifestyle in general, as well as the importance of weight control. We also talked about the importance of good sleep hygiene. I recommended the following at this time: sleep study.  I explained the sleep test procedure to the patient and also outlined possible surgical and non-surgical treatment options of OSA, including the use of a custom-made dental device (which would require a referral to a specialist dentist or oral surgeon), upper airway surgical options, such as traditional UPPP or a novel less invasive surgical option in the form of Inspire hypoglossal nerve stimulation (which would involve a referral to an ENT surgeon). I also explained the CPAP treatment option to the patient, who indicated that he would be willing to try CPAP or  autoPAP, if the need arises. I answered all his questions today and the patient was in agreement. I plan to see him back after the sleep study is completed and encouraged him to call with any interim questions, concerns, problems or updates.   Thank you very much for allowing me to participate in the care of this nice patient. If I can be of any further assistance to you please do not hesitate to call me at [phone].  Sincerely,   Star Age, MD, PhD

## 2020-07-06 ENCOUNTER — Telehealth: Payer: Self-pay

## 2020-07-06 NOTE — Telephone Encounter (Signed)
LVM for pt to call me back to schedule sleep study  

## 2020-07-09 NOTE — Progress Notes (Signed)
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  His first reported tachycardia was about 4 years ago when he woke up at 5 am with a rapid heartbeat. He states that over the last one year he has had such events about once or twice a month.  Family history David Lowery (III.10) has a healthy 42 year-old daughter (IV.1). His older brother (III.9) age 69 and younger sister, age 27 (III.10) are also in good health as are their children (IV.2-IV.3).   David Lowery's father, age 90 (II.4) and mother, age 25 (II.5) are alive and in good health. He reports sudden death in a paternal aunt. An autopsy was done that indicated heart attack as cause of death. There are no other reports of sudden death or heart disease amongst his relatives.    Genetics David Lowery was counseled on the genetics of isolated left ventricular noncompaction. I explained to him that LVNC can be associated with other syndromes such as Barth syndrome, mitochondrial disorders and myotonic dystrophy, or concomitantly present with hypertrophic and dilated cardiomyopathy. However, the major genetic cause for familial noncompaction has yet to be identified. I explained to him that there is extreme variability of the LVNC morphological spectrum and that it may also occur as a benign anatomic variant of left ventricular structure instead of a type of heart disease. He verbalized understanding of this.   I discussed the genetics of LVNC and explained that depending upon the underlying cause it may have X-linked or autosomal inheritance.  We walked through the process of genetic testing. Some studies have reported variants in sarcomeric genes in 29% to 41% of adult patients with LVNC.   Impression  In summary, David Lowery presents with LVNC at age 50. In the absence of a family history of sudden death or a cardiomyopathy, it is likely that his LVNC is a sporadic anatomic variant and not inherited, especially as he has preserved ejection fraction. In light of this, the clinical utility of genetic testing is very low and hence not warranted as there is a higher likelihood of having a negative result. He verbalized understanding of this.  Please note that the patient has not been counseled in this visit on personal, cultural or ethical issues that he may face due to his heart condition.   David Lowery, Ph.D, [facility] Clinical Molecular Geneticist

## 2020-07-13 ENCOUNTER — Telehealth: Payer: Self-pay

## 2020-07-13 NOTE — Telephone Encounter (Signed)
LVM for pt to call me back to schedule sleep study  

## 2020-08-02 ENCOUNTER — Ambulatory Visit (INDEPENDENT_AMBULATORY_CARE_PROVIDER_SITE_OTHER): Payer: BC Managed Care – PPO | Admitting: Neurology

## 2020-08-02 ENCOUNTER — Other Ambulatory Visit: Payer: Self-pay

## 2020-08-02 DIAGNOSIS — Z9889 Other specified postprocedural states: Secondary | ICD-10-CM

## 2020-08-02 DIAGNOSIS — R002 Palpitations: Secondary | ICD-10-CM

## 2020-08-02 DIAGNOSIS — G4733 Obstructive sleep apnea (adult) (pediatric): Secondary | ICD-10-CM | POA: Diagnosis not present

## 2020-08-02 DIAGNOSIS — R0683 Snoring: Secondary | ICD-10-CM

## 2020-08-02 DIAGNOSIS — I471 Supraventricular tachycardia: Secondary | ICD-10-CM

## 2020-08-02 DIAGNOSIS — E663 Overweight: Secondary | ICD-10-CM

## 2020-08-11 ENCOUNTER — Telehealth: Payer: Self-pay

## 2020-08-11 DIAGNOSIS — J45909 Unspecified asthma, uncomplicated: Secondary | ICD-10-CM | POA: Insufficient documentation

## 2020-08-11 NOTE — Telephone Encounter (Signed)
I called pt. No answer, left a message asking pt to call me back.   

## 2020-08-11 NOTE — Telephone Encounter (Signed)
-----   Message from Star Age, MD sent at 08/11/2020 12:03 PM EDT ----- Patient referred by Dr. Larose Kells for re-eval of OSA, seen by me on 07/05/20, HST on 08/02/20.    Please call and notify the patient that the recent home sleep test showed obstructive sleep apnea. OSA is overall mild, but worth treating to see if he feels better after treatment. To that end I recommend treatment for this in the form of autoPAP, which means, that we don't have to bring him in for a sleep study with CPAP, but will let him try an autoPAP machine at home, through a DME company (of his choice, or as per insurance requirement). The DME representative will educate him on how to use the machine, how to put the mask on, etc. I have placed an order in the chart. Please send referral, talk to patient, send report to referring MD. We will need a FU in sleep clinic for 10 weeks post-PAP set up, please arrange that with me or one of our NPs. Thanks,   Star Age, MD, PhD [facility])

## 2020-08-11 NOTE — Progress Notes (Signed)
Patient referred by Dr. Larose Kells for re-eval of OSA, seen by me on 07/05/20, HST on 08/02/20.    Please call and notify the patient that the recent home sleep test showed obstructive sleep apnea. OSA is overall mild, but worth treating to see if he feels better after treatment. To that end I recommend treatment for this in the form of autoPAP, which means, that we don't have to bring him in for a sleep study with CPAP, but will let him try an autoPAP machine at home, through a DME company (of his choice, or as per insurance requirement). The DME representative will educate him on how to use the machine, how to put the mask on, etc. I have placed an order in the chart. Please send referral, talk to patient, send report to referring MD. We will need a FU in sleep clinic for 10 weeks post-PAP set up, please arrange that with me or one of our NPs. Thanks,   David Age, MD, PhD [facility])

## 2020-08-11 NOTE — Procedures (Signed)
Sleep Study Report   Patient Information     First Name: David Last Name: Lowery ID: [id number]  Birth Date: July 27, 1971 Age: 49 yr. Gender: Male  Referring Provider: Colon Branch, MD BMI: 28.1 (W=207 lb, H=6' 0'')  Neck Circ.:  16 '' Epworth:  6/24  Sleep Study Information    Study Date: 08/02/20 S/H/A Version: [id number] / 4.2.1023 / 28  History:    49 year old man with a history of asthma, allergies, anxiety, palpitations and mildly overweight state, who was previously diagnosed with obstructive sleep apnea several years ago, he had subsequent UPPP. Snoring has recurred over time. In the past, he had trouble tolerating CPAP.  Summary & Diagnosis:    OSA Recommendations:     This home sleep test demonstrates overall mild obstructive sleep apnea with a total AHI of 14/hour and O2 nadir of 89%. Given the patient's medical history and sleep related complaints, treatment with positive airway pressure is recommended. This can be achieved in the form of autoPAP trial/titration at home. A  full night CPAP titration study will help with proper treatment settings and mask fitting if needed, down the road. Alternative treatments may include weight loss along with avoidance of the supine sleep position, or an oral appliance in appropriate candidates.   Please note that untreated obstructive sleep apnea may carry additional perioperative morbidity. Patients with significant obstructive sleep apnea should receive perioperative PAP therapy and the surgeons and particularly the anesthesiologist should be informed of the diagnosis and the severity of the sleep disordered breathing. The patient should be cautioned not to drive, work at heights, or operate dangerous or heavy equipment when tired or sleepy. Review and reiteration of good sleep hygiene measures should be pursued with any patient. Other causes of the patient's symptoms, including circadian rhythm disturbances, an underlying mood disorder, medication  effect and/or an underlying medical problem cannot be ruled out based on this test. Clinical correlation is recommended.   The patient and his referring provider will be notified of the test results. The patient will be seen in follow up in sleep clinic at [facility] as needed, especially, if he starts autoPAP therapy.  I certify that I have reviewed the raw data recording prior to the issuance of this report in accordance with the standards of the American Academy of Sleep Medicine (AASM).  Star Age, MD, PhD [facility]) Diplomat, ABPN (Neurology and Sleep)         Sleep Summary  Oxygen Saturation Statistics   Start Study Time: End Study Time: Total Recording Time:  11:45:37 PM 8:15:05 AM 8 h, 29 min  Total Sleep Time % REM of Sleep Time:  7 h, 3 min  26.5    Mean: 93 Minimum: 89 Maximum: 98  Mean of Desaturations Nadirs (%):   92  Oxygen Desaturation. %: 4-9 10-20 >20 Total  Events Number Total  39 100.0  0 0.0  0 0.0  39 100.0  Oxygen Saturation: <90 <=88 <85 <80 <70  Duration (minutes): Sleep % 0.1 0.0 0.0 0.0 0.0 0.0 0.0 0.0 0.0 0.0     Respiratory Indices      Total Events REM NREM All Night  pRDI: pAHI 3%: ODI 4%: pAHIc 3%: % CSR: pAHI 4%: 161   99  39  7 0.0 52 33.1 24.6 11.8 2.4 19.1 10.2 3.3 0.7 22.8 14.0 5.5 1.1 7.4       Pulse Rate Statistics during Sleep (BPM)      Mean: 52  Minimum: 41 Maximum: 89    Indices are calculated using technically valid sleep time of 7 h, 3 min.                      pAHI=14.0                                   Mild              Moderate                    Severe                                                 5              15                    30   Body Position Statistics  Position Supine Prone Right Left Non-Supine  Sleep (min) 123.0 63.0 54.5 175.5 293.0  Sleep % 29.0 14.9 12.9 41.4 69.1  pRDI 25.9 19.1 28.8 19.9 21.4  pAHI 3% 14.7 12.4 20.0 11.6 13.4  ODI 4% 7.8  4.8 6.7 3.4 4.3               Left   Prone  Right  Supine    Snoring Statistics Snoring Level (dB) >40 >50 >60 >70 >80 >Threshold (45)  Sleep (min) 202.2 5.1 0.7 0.0 0.0 10.2  Sleep % 47.7 1.2 0.2 0.0 0.0 2.4    Mean: 41 dB Sleep Stages Chart                         Wake  Sleep      Wake  16.78  %    Sleep  83.22  %   Total:  100.00  %                                                       REM  Light  Deep      REM  26.54  %    Light  58.60  %    Deep  14.86  %   Total:  100.00  %                                 Sleep/Wake States  Sleep Stages  Sleep Latency (min):  REM Latency (min):  Number of Wakes:   6   105   10

## 2020-08-11 NOTE — Addendum Note (Signed)
Addended by: Star Age on: 08/11/2020 12:03 PM   Modules accepted: Orders

## 2020-08-12 NOTE — Telephone Encounter (Signed)
Pt has returned the call to Megan,RN °

## 2020-08-16 ENCOUNTER — Ambulatory Visit (INDEPENDENT_AMBULATORY_CARE_PROVIDER_SITE_OTHER): Payer: BC Managed Care – PPO | Admitting: Cardiology

## 2020-08-16 ENCOUNTER — Other Ambulatory Visit: Payer: Self-pay

## 2020-08-16 ENCOUNTER — Encounter: Payer: Self-pay | Admitting: Cardiology

## 2020-08-16 VITALS — BP 128/84 | HR 60 | Ht 72.0 in | Wt 205.0 lb

## 2020-08-16 DIAGNOSIS — R002 Palpitations: Secondary | ICD-10-CM | POA: Diagnosis not present

## 2020-08-16 DIAGNOSIS — G4733 Obstructive sleep apnea (adult) (pediatric): Secondary | ICD-10-CM

## 2020-08-16 DIAGNOSIS — E781 Pure hyperglyceridemia: Secondary | ICD-10-CM | POA: Diagnosis not present

## 2020-08-16 DIAGNOSIS — F419 Anxiety disorder, unspecified: Secondary | ICD-10-CM

## 2020-08-16 DIAGNOSIS — I428 Other cardiomyopathies: Secondary | ICD-10-CM

## 2020-08-16 NOTE — Addendum Note (Signed)
Addended by: Senaida Ores on: 08/16/2020 08:54 AM   Modules accepted: Orders

## 2020-08-16 NOTE — Patient Instructions (Signed)
Medication Instructions:  Your physician recommends that you continue on your current medications as directed. Please refer to the Current Medication list given to you today.  *If you need a refill on your cardiac medications before your next appointment, please call your pharmacy*   Lab Work: None If you have labs (blood work) drawn today and your tests are completely normal, you will receive your results only by: . MyChart Message (if you have MyChart) OR . A paper copy in the mail If you have any lab test that is abnormal or we need to change your treatment, we will call you to review the results.   Testing/Procedures: A zio monitor was ordered today. It will remain on for 7 days. You will then return monitor and event diary in provided box. It takes 1-2 weeks for report to be downloaded and returned to us. We will call you with the results. If monitor falls off or has orange flashing light, please call Zio for further instructions.      Follow-Up: At [facility], you and your health needs are our priority.  As part of our continuing mission to provide you with exceptional heart care, we have created designated Provider Care Teams.  These Care Teams include your primary Cardiologist (physician) and Advanced Practice Providers (APPs -  Physician Assistants and Nurse Practitioners) who all work together to provide you with the care you need, when you need it.  We recommend signing up for the patient portal called "MyChart".  Sign up information is provided on this After Visit Summary.  MyChart is used to connect with patients for Virtual Visits (Telemedicine).  Patients are able to view lab/test results, encounter notes, upcoming appointments, etc.  Non-urgent messages can be sent to your provider as well.   To learn more about what you can do with MyChart, go to [email or web address].    Your next appointment:   6 month(s)  The format for your next appointment:   In  Person  Provider:   Robert Krasowski, MD   Other Instructions    

## 2020-08-16 NOTE — Progress Notes (Signed)
Cardiology Office Note:    Date:  08/16/2020   ID:  David Lowery, DOB 04-26-1971, MRN [id number]  PCP:  Colon Branch, MD  Cardiologist:  Jenne Campus, MD    Referring MD: Colon Branch, MD   No chief complaint on file. Started having palpitations at night  History of Present Illness:    David Lowery is a 49 y.o. 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  He comes today to my office for follow-up.  Likely his left ventricle ejection fraction is still preserved.  He complained of having palpitations.  He wakes up in the middle of night with his heart speeding up.  He showed me some data from his heart rates goes up to about 130 bpm.  Also recently he had sleep study done which showed some evidence of sleep apnea.  I suspect his palpitations are related to it.  However, we will put monitor to make sure he does not have any significant arrhythmia.  He already had arrangements made to manage his sleep apnea better.  Apparently years ago he had surgery for sleep apnea in 2007 which helped some but still sleep apnea is present.  Denies have any chest pain tightness squeezing pressure burning chest no shortness of breath no dizziness no passing out.  Past Medical History:  Diagnosis Date  . Annual physical exam 08/12/2014  . Anxiety   . Asthma due to seasonal allergies   . Dyspnea on exertion 02/09/2020  . Hypertriglyceridemia 02/09/2020  . Inguinal hernia 05/07/2014  . 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 OSA (obstructive sleep apnea)    initial sx: palpitations-fatigue--s/p surgery, much improved   . Palpitations 02/09/2020  . PCP NOTES >>>>>>>>>>>>>>>> 03/28/2016  . Seasonal allergies     Past Surgical History:  Procedure Laterality Date  . CHOLECYSTECTOMY N/A 12/06/2016   Procedure: LAPAROSCOPIC CHOLECYSTECTOMY WITH INTRAOPERATIVE CHOLANGIOGRAM;  Surgeon: Autumn Messing III, MD;  Location: [place];  Service:  General;  Laterality: N/A;  . MASS EXCISION N/A 08/31/2017   Procedure: MINOR EXCISION SEBACEOUS CYST ON BACK X 2;  Surgeon: Autumn Messing III, MD;  Location: [place];  Service: General;  Laterality: N/A;  . THROAT SURGERY  2011   tonsilectomy, uvulectomy d/t OSA     Current Medications: Current Meds  Medication Sig  . albuterol (VENTOLIN HFA) 108 (90 Base) MCG/ACT inhaler Inhale 2 puffs into the lungs every 6 (six) hours as needed for wheezing or shortness of breath.  Marland Kitchen aspirin EC 81 MG tablet Take 1 tablet (81 mg total) by mouth daily. Swallow whole.  . escitalopram (LEXAPRO) 20 MG tablet Take 1 tablet (20 mg total) by mouth daily.  . fexofenadine (ALLEGRA) 180 MG tablet Take 180 mg by mouth every other day.   . levocetirizine (XYZAL) 5 MG tablet Take 5 mg by mouth every other day.  . mometasone (NASONEX) 50 MCG/ACT nasal spray Place 2 sprays into the nose daily as needed.     Allergies:   Sudafed [pseudoephedrine]   Social History   Socioeconomic History  . Marital status: Married    Spouse name: Not on file  . Number of children: 1  . Years of education: Not on file  . Highest education level: Not on file  Occupational History  . Occupation: Pharmacologist: Geneticist, molecular    Comment: [employer]  Tobacco Use  . Smoking status: Never Smoker  .  Smokeless tobacco: Never Used  Substance and Sexual Activity  . Alcohol use: Yes    Comment: Less than 2 drinks per month   . Drug use: No  . Sexual activity: Not on file  Other Topics Concern  . Not on file  Social History Narrative   Moved to [facility] July 2014, from [employer]   Son born ~ 2012   Social Determinants of Health   Financial Resource Strain:   . Difficulty of Paying Living Expenses: Not on file  Food Insecurity:   . Worried About Charity fundraiser in the Last Year: Not on file  . Ran Out of Food in the Last Year: Not on file  Transportation Needs:   . Lack of  Transportation (Medical): Not on file  . Lack of Transportation (Non-Medical): Not on file  Physical Activity:   . Days of Exercise per Week: Not on file  . Minutes of Exercise per Session: Not on file  Stress:   . Feeling of Stress : Not on file  Social Connections:   . Frequency of Communication with Friends and Family: Not on file  . Frequency of Social Gatherings with Friends and Family: Not on file  . Attends Religious Services: Not on file  . Active Member of Clubs or Organizations: Not on file  . Attends Archivist Meetings: Not on file  . Marital Status: Not on file     Family History: [name] family history includes Colon cancer in an other family member; Diabetes in an other family member; Hyperlipidemia in his brother. There is no history of CAD, Stroke, or Prostate cancer. ROS:   Please see the history of present illness.    All 14 point review of systems negative except as described per history of present illness  EKGs/Labs/Other Studies Reviewed:      Recent Labs: 01/07/2020: ALT 37; BUN 15; Creatinine, Ser 0.77; Hemoglobin 15.0; Platelets 258.0; Potassium 4.0; Sodium 136; TSH 0.77  Recent Lipid Panel    Component Value Date/Time   CHOL 187 03/05/2018 1334   TRIG 253.0 (H) 03/05/2018 1334   HDL 37.70 (L) 03/05/2018 1334   CHOLHDL 5 03/05/2018 1334   VLDL 50.6 (H) 03/05/2018 1334   LDLCALC 129 (H) 12/03/2015 0937   LDLDIRECT 98.0 03/05/2018 1334    Physical Exam:    VS:  BP 128/84   Pulse 60   Ht 6' (1.829 m)   Wt 205 lb (93 kg)   SpO2 98%   BMI 27.80 kg/m     Wt Readings from Last 3 Encounters:  08/16/20 205 lb (93 kg)  07/05/20 206 lb (93.4 kg)  05/26/20 199 lb 8 oz (90.5 kg)     GEN:  Well nourished, well developed in no acute distress HEENT: Normal NECK: No JVD; No carotid bruits LYMPHATICS: No lymphadenopathy CARDIAC: RRR, no murmurs, no rubs, no gallops RESPIRATORY:  Clear to auscultation without rales, wheezing or rhonchi   ABDOMEN: Soft, non-tender, non-distended MUSCULOSKELETAL:  No edema; No deformity  SKIN: Warm and dry LOWER EXTREMITIES: no swelling NEUROLOGIC:  Alert and oriented x 3 PSYCHIATRIC:  Normal affect   ASSESSMENT:    1. Palpitations   2. 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 OSA (obstructive sleep apnea)   4. Hypertriglyceridemia   5. Anxiety    PLAN:    In order of problems listed above:  1. Palpitations: We will put Zio patch on him to see exactly what kind of arrhythmia  we dealing with. 2. Noncompaction.  Preserved left ventricle ejection fraction.  Palpitations will be investigated.  His ejection fraction is preserved he is on aspirin only and there is no really good data in terms of any medical therapy at this stage.  We will follow his echocardiogram on the regular basis make sure his ejection fraction stay preserved. 3. Obstructive sleep apnea used to be severe heart surgery however it is doing better right now he is repeated sleep study shows still presence of sleep apnea and he does have arrangements to manage this problem appropriately. 4. Hypertriglyceridemia we will make arrangements for cholesterol check. 5.  6. Anxiety noted but seems to be stable.   Medication Adjustments/Labs and Tests Ordered: Current medicines are reviewed at length with the patient today.  Concerns regarding medicines are outlined above.  No orders of the defined types were placed in this encounter.  Medication changes: No orders of the defined types were placed in this encounter.   Signed, Park Liter, MD, [facility] 08/16/2020 8:49 AM    [place]

## 2020-08-16 NOTE — Telephone Encounter (Signed)
I called pt. I advised pt that Dr. Rexene Alberts reviewed their sleep study results and found that pt had mild sleep apnea. Dr. Rexene Alberts recommends that pt start treatment with autopap. I reviewed PAP compliance expectations with the pt. Pt is agreeable to starting an auto-PAP. I advised pt that an order will be sent to a DME, [facility], and [facility] will call the pt within about one week after they file with the pt's insurance. [facility] will show the pt how to use the machine, fit for masks, and troubleshoot the auto-PAP if needed. Once the patient receives the machine, he will call our office to schedule ten week follow up appt with Dr. Rexene Alberts. Pt verbalized understanding of results. Pt had no questions at this time but was encouraged to call back if questions arise. I have sent the order to [facility] and have received confirmation that they have received the order.

## 2020-08-16 NOTE — Telephone Encounter (Signed)
Pt has called Sharyn Lull, RN back.  Please call with results

## 2020-08-16 NOTE — Telephone Encounter (Signed)
Left message requesting a call back.

## 2020-08-17 DIAGNOSIS — J3089 Other allergic rhinitis: Secondary | ICD-10-CM | POA: Diagnosis not present

## 2020-08-17 DIAGNOSIS — J3081 Allergic rhinitis due to animal (cat) (dog) hair and dander: Secondary | ICD-10-CM | POA: Diagnosis not present

## 2020-08-17 DIAGNOSIS — J301 Allergic rhinitis due to pollen: Secondary | ICD-10-CM | POA: Diagnosis not present

## 2020-08-21 ENCOUNTER — Ambulatory Visit (INDEPENDENT_AMBULATORY_CARE_PROVIDER_SITE_OTHER): Payer: BC Managed Care – PPO

## 2020-08-21 DIAGNOSIS — R002 Palpitations: Secondary | ICD-10-CM | POA: Diagnosis not present

## 2020-08-23 HISTORY — PX: OTHER SURGICAL HISTORY: SHX169

## 2020-08-30 ENCOUNTER — Ambulatory Visit: Payer: BC Managed Care – PPO | Admitting: Internal Medicine

## 2020-08-30 DIAGNOSIS — J301 Allergic rhinitis due to pollen: Secondary | ICD-10-CM | POA: Diagnosis not present

## 2020-08-30 DIAGNOSIS — J3081 Allergic rhinitis due to animal (cat) (dog) hair and dander: Secondary | ICD-10-CM | POA: Diagnosis not present

## 2020-08-30 DIAGNOSIS — J3089 Other allergic rhinitis: Secondary | ICD-10-CM | POA: Diagnosis not present

## 2020-09-02 ENCOUNTER — Other Ambulatory Visit: Payer: Self-pay

## 2020-09-02 ENCOUNTER — Ambulatory Visit (INDEPENDENT_AMBULATORY_CARE_PROVIDER_SITE_OTHER): Payer: BC Managed Care – PPO | Admitting: Internal Medicine

## 2020-09-02 ENCOUNTER — Encounter: Payer: Self-pay | Admitting: Internal Medicine

## 2020-09-02 VITALS — BP 146/81 | HR 76 | Temp 98.0°F | Resp 16 | Ht 72.0 in | Wt 206.1 lb

## 2020-09-02 DIAGNOSIS — R002 Palpitations: Secondary | ICD-10-CM | POA: Diagnosis not present

## 2020-09-02 DIAGNOSIS — I428 Other cardiomyopathies: Secondary | ICD-10-CM | POA: Diagnosis not present

## 2020-09-02 DIAGNOSIS — F419 Anxiety disorder, unspecified: Secondary | ICD-10-CM

## 2020-09-02 DIAGNOSIS — D3500 Benign neoplasm of unspecified adrenal gland: Secondary | ICD-10-CM

## 2020-09-02 NOTE — Patient Instructions (Signed)
Will refer you to endocrinology to r/o pheochromocytoma

## 2020-09-02 NOTE — Progress Notes (Signed)
Subjective:    Patient ID: David Lowery, male    DOB: December 20, 1970, 49 y.o.   MRN: [id number]  DOS:  09/02/2020 Type of visit - description: Follow-up Here for a follow-up, he likes to keep me updated on his progress. Note from cardiology and neurology reviewed.  He reports that he is having panic attacks over the last few weeks: 2 to 3 times a week what is happening is that he feels like a rush through his legs, palpitations starts, symptoms gradually decrease over the following 3 minutes. There are no clear triggers, he really does not feel anxious per se.  No associated headache, nausea, sweats, fainting. No diarrhea or seizure activity.   Review of Systems See above   Past Medical History:  Diagnosis Date  . Annual physical exam 08/12/2014  . Anxiety   . Asthma due to seasonal allergies   . Dyspnea on exertion 02/09/2020  . Hypertriglyceridemia 02/09/2020  . Inguinal hernia 05/07/2014  . 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 OSA (obstructive sleep apnea)    initial sx: palpitations-fatigue--s/p surgery, much improved   . Palpitations 02/09/2020  . PCP NOTES >>>>>>>>>>>>>>>> 03/28/2016  . Seasonal allergies     Past Surgical History:  Procedure Laterality Date  . CHOLECYSTECTOMY N/A 12/06/2016   Procedure: LAPAROSCOPIC CHOLECYSTECTOMY WITH INTRAOPERATIVE CHOLANGIOGRAM;  Surgeon: Autumn Messing III, MD;  Location: [place];  Service: General;  Laterality: N/A;  . MASS EXCISION N/A 08/31/2017   Procedure: MINOR EXCISION SEBACEOUS CYST ON BACK X 2;  Surgeon: Jovita Kussmaul, MD;  Location: [place];  Service: General;  Laterality: N/A;  . THROAT SURGERY  2011   tonsilectomy, uvulectomy d/t OSA     Allergies as of 09/02/2020      Reactions   Sudafed [pseudoephedrine] Anxiety, Palpitations      Medication List       Accurate as of September 02, 2020 11:59 PM. If you have any questions, ask your nurse or doctor.        albuterol 108 (90 Base) MCG/ACT  inhaler Commonly known as: Ventolin HFA Inhale 2 puffs into the lungs every 6 (six) hours as needed for wheezing or shortness of breath.   aspirin EC 81 MG tablet Take 1 tablet (81 mg total) by mouth daily. Swallow whole.   escitalopram 20 MG tablet Commonly known as: LEXAPRO Take 1 tablet (20 mg total) by mouth daily.   fexofenadine 180 MG tablet Commonly known as: ALLEGRA Take 180 mg by mouth every other day.   levocetirizine 5 MG tablet Commonly known as: XYZAL Take 5 mg by mouth every other day.   metoprolol succinate 25 MG 24 hr tablet Commonly known as: TOPROL-XL Take 1 tablet (25 mg total) by mouth daily. Take with or immediately following a meal.   mometasone 50 MCG/ACT nasal spray Commonly known as: NASONEX Place 2 sprays into the nose daily as needed.          Objective:   Physical Exam BP (!) 146/81 (BP Location: Right Arm, Patient Position: Sitting, Cuff Size: Normal)   Pulse 76   Temp 98 F (36.7 C) (Oral)   Resp 16   Ht 6' (1.829 m)   Wt 206 lb 2 oz (93.5 kg)   SpO2 97%   BMI 27.96 kg/m  General:   Well developed, NAD, BMI noted. HEENT:  Normocephalic . Face symmetric, atraumatic  Lower extremities: no pretibial edema bilaterally  Skin: Not pale. Not jaundice Neurologic:  alert &  oriented X3.  Speech normal, gait appropriate for age and unassisted Psych--  Cognition and judgment appear intact.  Cooperative with normal attention span and concentration.  Behavior appropriate. No anxious or depressed appearing.      Assessment     Assessment OSA: Had a remote s/p uvulectomy, improved symptoms.  Home sleep study 07-2020: +, Mild Anxiety Allergies, bronchospasm Hernia L , saw surgery 2015, offered surgery Actinic keratosis, BCC (02/2020); Nevus  excisions 2015 CV: Palpitations: Monitor short run of SVT, MRI heart: Noncompaction  PLAN Here basically to update me on his progress. Anxiety: About 3 months ago we switch sertraline to Lexapro,  anxiety per se is well controlled, he admits that he is "high strung". Over the last few weeks he is having "panic attacks".  See description above, I really would not characterize those episodes as panic attacks, is probably something else. While cardiology proceed with a work-up, I rec to  R/O uncommon issue such as pheochromocytoma or carcinoid syndrome :  Refer to Endo. Palpitation, cardiac noncompaction: Last visit with cardiology 08/16/2020: A Zio patch was done, results pending Noncompaction with preserved LV EF.  Will need follow-up echoes. OSA:  repeat a home sleep study recently  showed mild OSA , neurology rec  AutoPap to see if that makes him feel better.  He is waiting for the equipment Preventive care: Declined at a flu shot   Time spent 32 min  This visit occurred during the SARS-CoV-2 public health emergency.  Safety protocols were in place, including screening questions prior to the visit, additional usage of staff PPE, and extensive cleaning of exam room while observing appropriate contact time as indicated for disinfecting solutions.

## 2020-09-02 NOTE — Progress Notes (Signed)
Pre visit review using our clinic review tool, if applicable. No additional management support is needed unless otherwise documented below in the visit note. 

## 2020-09-03 NOTE — Assessment & Plan Note (Signed)
Here basically to update me on his progress. Anxiety: About 3 months ago we switch sertraline to Lexapro, anxiety per se is well controlled, he admits that he is "high strung". Over the last few weeks he is having "panic attacks".  See description above, I really would not characterize those episodes as panic attacks, is probably something else. While cardiology proceed with a work-up, I rec to  R/O uncommon issue such as pheochromocytoma or carcinoid syndrome :  Refer to Endo. Palpitation, cardiac noncompaction: Last visit with cardiology 08/16/2020: A Zio patch was done, results pending Noncompaction with preserved LV EF.  Will need follow-up echoes. OSA:  repeat a home sleep study recently  showed mild OSA , neurology rec  AutoPap to see if that makes him feel better.  He is waiting for the equipment Preventive care: Declined at a flu shot

## 2020-09-06 DIAGNOSIS — C44311 Basal cell carcinoma of skin of nose: Secondary | ICD-10-CM | POA: Diagnosis not present

## 2020-09-06 DIAGNOSIS — L219 Seborrheic dermatitis, unspecified: Secondary | ICD-10-CM | POA: Diagnosis not present

## 2020-09-07 DIAGNOSIS — R002 Palpitations: Secondary | ICD-10-CM | POA: Diagnosis not present

## 2020-09-11 DIAGNOSIS — J3089 Other allergic rhinitis: Secondary | ICD-10-CM | POA: Diagnosis not present

## 2020-09-11 DIAGNOSIS — J3081 Allergic rhinitis due to animal (cat) (dog) hair and dander: Secondary | ICD-10-CM | POA: Diagnosis not present

## 2020-09-11 DIAGNOSIS — J301 Allergic rhinitis due to pollen: Secondary | ICD-10-CM | POA: Diagnosis not present

## 2020-09-13 DIAGNOSIS — J3089 Other allergic rhinitis: Secondary | ICD-10-CM | POA: Diagnosis not present

## 2020-09-13 DIAGNOSIS — J301 Allergic rhinitis due to pollen: Secondary | ICD-10-CM | POA: Diagnosis not present

## 2020-09-13 DIAGNOSIS — J3081 Allergic rhinitis due to animal (cat) (dog) hair and dander: Secondary | ICD-10-CM | POA: Diagnosis not present

## 2020-09-15 ENCOUNTER — Other Ambulatory Visit: Payer: Self-pay | Admitting: Internal Medicine

## 2020-09-28 ENCOUNTER — Encounter: Payer: Self-pay | Admitting: Emergency Medicine

## 2020-11-03 DIAGNOSIS — L309 Dermatitis, unspecified: Secondary | ICD-10-CM | POA: Diagnosis not present

## 2020-11-03 DIAGNOSIS — D485 Neoplasm of uncertain behavior of skin: Secondary | ICD-10-CM | POA: Diagnosis not present

## 2020-11-03 DIAGNOSIS — D225 Melanocytic nevi of trunk: Secondary | ICD-10-CM | POA: Diagnosis not present

## 2020-11-03 DIAGNOSIS — C44321 Squamous cell carcinoma of skin of nose: Secondary | ICD-10-CM | POA: Diagnosis not present

## 2020-11-03 DIAGNOSIS — L570 Actinic keratosis: Secondary | ICD-10-CM | POA: Diagnosis not present

## 2020-11-15 DIAGNOSIS — J301 Allergic rhinitis due to pollen: Secondary | ICD-10-CM | POA: Diagnosis not present

## 2020-11-15 DIAGNOSIS — J3089 Other allergic rhinitis: Secondary | ICD-10-CM | POA: Diagnosis not present

## 2020-11-15 DIAGNOSIS — J3081 Allergic rhinitis due to animal (cat) (dog) hair and dander: Secondary | ICD-10-CM | POA: Diagnosis not present

## 2020-11-17 DIAGNOSIS — L209 Atopic dermatitis, unspecified: Secondary | ICD-10-CM | POA: Diagnosis not present

## 2020-11-18 DIAGNOSIS — G4733 Obstructive sleep apnea (adult) (pediatric): Secondary | ICD-10-CM | POA: Diagnosis not present

## 2020-11-23 ENCOUNTER — Encounter: Payer: Self-pay | Admitting: Internal Medicine

## 2020-11-23 ENCOUNTER — Ambulatory Visit (INDEPENDENT_AMBULATORY_CARE_PROVIDER_SITE_OTHER): Payer: BC Managed Care – PPO | Admitting: Internal Medicine

## 2020-11-23 ENCOUNTER — Other Ambulatory Visit: Payer: Self-pay

## 2020-11-23 VITALS — BP 136/86 | HR 68 | Ht 72.0 in | Wt 190.1 lb

## 2020-11-23 DIAGNOSIS — R002 Palpitations: Secondary | ICD-10-CM | POA: Diagnosis not present

## 2020-11-23 NOTE — Progress Notes (Signed)
Name: David Lowery  MRN/ DOB: [id number], 1971/08/03    Lowery/ Sex: 50 y.o., male    PCP: David Branch, MD   Reason for Endocrinology Evaluation: Palpitations / R/O pheochromocytoma     Date of Initial Endocrinology Evaluation: 11/23/2020     HPI: Mr. David Lowery is a 50 y.o. male with a past medical history of OSA and anxiety. The patient presented for initial endocrinology clinic visit on 11/23/2020 for consultative assistance with his Palpitations ( R/O pheochromoctyoma).     IN 2003 has been diagnosed with panic disorder , was started on Zoloft which helped for ~ 13 yrs, approximately a year ago he noted worsening in anxiety, wakes up in the morning with heart rate jumps from 62 to 125 bpm. Zoloft changed to Lexapro but palpitations has not changed.   Pt is under the care of cardiology for palpitations, this is though to be related to OSA but continuing workup for arrhythmias ( Zio patch). Metoprolol has not helped.   He is being referred here for further investigation of pheochromocytoma Of note, the pt had an abdominal CT in 2018 with normal adrenals.    Severe hypertension- no Fatigue- no Sudden/ severe headaches- no Anxiety attacks- yes Sweating-no Cardiac arrhythmias- pending work up Palpitations- yes  He is not using any decongestants    HISTORY:  Past Medical History:  Past Medical History:  Diagnosis Date  . Annual physical exam 08/12/2014  . Anxiety   . Asthma due to seasonal allergies   . Dyspnea on exertion 02/09/2020  . Hypertriglyceridemia 02/09/2020  . Inguinal hernia 05/07/2014  . 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 OSA (obstructive sleep apnea)    initial sx: palpitations-fatigue--s/p surgery, much improved   . Palpitations 02/09/2020  . PCP NOTES >>>>>>>>>>>>>>>> 03/28/2016  . Seasonal allergies     Past Surgical History:  Past Surgical History:  Procedure Laterality Date  . CHOLECYSTECTOMY N/A 12/06/2016   Procedure:  LAPAROSCOPIC CHOLECYSTECTOMY WITH INTRAOPERATIVE CHOLANGIOGRAM;  Surgeon: David Messing III, MD;  Location: [facility];  Service: General;  Laterality: N/A;  . MASS EXCISION N/A 08/31/2017   Procedure: MINOR EXCISION SEBACEOUS CYST ON BACK X 2;  Surgeon: David Kussmaul, MD;  Location: [place];  Service: General;  Laterality: N/A;  . THROAT SURGERY  2011   tonsilectomy, uvulectomy d/t OSA       Social History:  reports that he has never smoked. He has never used smokeless tobacco. He reports current alcohol use. He reports that he does not use drugs.  Family History: family history includes David cancer in an other family member; Diabetes in an other family member; Hyperlipidemia in his brother.   HOME MEDICATIONS: Allergies as of 11/23/2020      Reactions   Sudafed [pseudoephedrine] Anxiety, Palpitations      Medication List       Accurate as of November 23, 2020  6:59 AM. If you have any questions, ask your nurse or doctor.        albuterol 108 (90 Base) MCG/ACT inhaler Commonly known as: Ventolin HFA Inhale 2 puffs into the lungs every 6 (six) hours as needed for wheezing or shortness of breath.   aspirin EC 81 MG tablet Take 1 tablet (81 mg total) by mouth daily. Swallow whole.   escitalopram 20 MG tablet Commonly known as: LEXAPRO Take 1 tablet (20 mg total) by mouth daily.   fexofenadine 180 MG tablet Commonly known as: ALLEGRA Take 180  mg by mouth every other day.   levocetirizine 5 MG tablet Commonly known as: XYZAL Take 5 mg by mouth every other day.   metoprolol succinate 25 MG 24 hr tablet Commonly known as: TOPROL-XL Take 1 tablet (25 mg total) by mouth daily. Take with or immediately following a meal.   mometasone 50 MCG/ACT nasal spray Commonly known as: NASONEX Place 2 sprays into the nose daily as needed.         REVIEW OF SYSTEMS: A comprehensive ROS was conducted with the patient and is negative except as per HPI and below:  ROS      OBJECTIVE:  VS: There were no vitals taken for this visit.   Wt Readings from Last 3 Encounters:  09/02/20 206 lb 2 oz (93.5 kg)  08/16/20 205 lb (93 kg)  07/05/20 206 lb (93.4 kg)     EXAM: General: Pt appears well and is in NAD  Neck: General: Supple without adenopathy. Thyroid: Thyroid size normal.  No goiter or nodules appreciated.  Lungs: Clear with good BS bilat with no rales, rhonchi, or wheezes  Heart: Auscultation: RRR.  Abdomen: Normoactive bowel sounds, soft, nontender, without masses or organomegaly palpable  Extremities:  BL LE: No pretibial edema normal ROM and strength.  Skin: Hair: Texture and amount normal with gender appropriate distribution Skin Inspection: No rashes Skin Palpation: Skin temperature, texture, and thickness normal to palpation  Neuro: Cranial nerves: II - XII grossly intact  Cerebellar: Normal coordination and movement; no tremor Motor: Normal strength throughout DTRs: 2+ and symmetric in UE without delay in relaxation phase  Mental Status: Judgment, insight: Intact Memory: Intact for recent and remote events Mood and affect: No depression, anxiety, or agitation     DATA REVIEWED: Results for David Lowery (MRN [id number]) as of 11/24/2020 13:51  Ref. Range 11/23/2020 11:07  TSH Latest Ref Range: 0.40 - 4.50 mIU/L 0.97    ASSESSMENT/PLAN/RECOMMENDATIONS:   1. Palpitations/ Anxiety:  - Pt is being referred to rule out pheochromocytoma , I have low clinical suspicion for pheo at this time.  - Will proceed with 24- hr urinary catecholamine/metanephrines  - TFT's are normal   F/U pending labs results  Signed electronically by: David Guise, MD  [facility]  [address] [place], [place] [address] Phone: [phone] FAX: [phone]   CC: David Lowery, [address] [place] [address] Phone: [phone] Fax: [phone]   Return to  Endocrinology clinic as below: Future Appointments  Date Time Provider [place]  11/23/2020 10:30 AM David Lowery, David Crazier, MD [facility]  01/19/2021  8:30 AM David Age, MD [facility] None

## 2020-11-23 NOTE — Patient Instructions (Signed)

## 2020-11-24 LAB — TSH: TSH: 0.97 mIU/L (ref 0.40–4.50)

## 2020-11-29 ENCOUNTER — Other Ambulatory Visit: Payer: BC Managed Care – PPO

## 2020-11-29 ENCOUNTER — Other Ambulatory Visit: Payer: Self-pay

## 2020-11-29 DIAGNOSIS — R002 Palpitations: Secondary | ICD-10-CM | POA: Diagnosis not present

## 2020-11-30 DIAGNOSIS — J301 Allergic rhinitis due to pollen: Secondary | ICD-10-CM | POA: Diagnosis not present

## 2020-11-30 DIAGNOSIS — J3089 Other allergic rhinitis: Secondary | ICD-10-CM | POA: Diagnosis not present

## 2020-11-30 DIAGNOSIS — J3081 Allergic rhinitis due to animal (cat) (dog) hair and dander: Secondary | ICD-10-CM | POA: Diagnosis not present

## 2020-12-03 LAB — CATECHOLAMINES, FRACTIONATED, URINE, 24 HOUR
Calc Total (E+NE): 12 mcg/24 h — ABNORMAL LOW (ref 26–121)
Creatinine, Urine mg/day-CATEUR: 1.26 g/(24.h) (ref 0.50–2.15)
Dopamine 24 Hr Urine: 148 mcg/24 h (ref 52–480)
Norepinephrine, 24H, Ur: 12 mcg/24 h — ABNORMAL LOW (ref 15–100)
Total Volume: 1200 mL

## 2020-12-04 LAB — METANEPHRINES, URINE, 24 HOUR
METANEPHRINE: 86 mcg/24 h (ref 58–203)
METANEPHRINES, TOTAL: 191 mcg/24 h (ref 182–739)
NORMETANEPHRINE: 105 mcg/24 h (ref 88–649)
Total Volume: 1200 mL

## 2020-12-10 ENCOUNTER — Encounter: Payer: Self-pay | Admitting: Internal Medicine

## 2020-12-10 ENCOUNTER — Ambulatory Visit (INDEPENDENT_AMBULATORY_CARE_PROVIDER_SITE_OTHER): Payer: BC Managed Care – PPO | Admitting: Internal Medicine

## 2020-12-10 ENCOUNTER — Other Ambulatory Visit: Payer: Self-pay

## 2020-12-10 VITALS — BP 142/90 | HR 76 | Temp 98.0°F | Resp 18 | Ht 72.0 in | Wt 188.0 lb

## 2020-12-10 DIAGNOSIS — F419 Anxiety disorder, unspecified: Secondary | ICD-10-CM

## 2020-12-10 DIAGNOSIS — R002 Palpitations: Secondary | ICD-10-CM

## 2020-12-10 DIAGNOSIS — Z09 Encounter for follow-up examination after completed treatment for conditions other than malignant neoplasm: Secondary | ICD-10-CM

## 2020-12-10 MED ORDER — SERTRALINE HCL 100 MG PO TABS
100.0000 mg | ORAL_TABLET | Freq: Every day | ORAL | 3 refills | Status: DC
Start: 2020-12-10 — End: 2021-01-03

## 2020-12-10 MED ORDER — CLONAZEPAM 0.25 MG PO TBDP: 0.2500 mg | ORAL_TABLET | Freq: Two times a day (BID) | ORAL | 0 refills | Status: DC

## 2020-12-10 NOTE — Progress Notes (Signed)
Pre visit review using our clinic review tool, if applicable. No additional management support is needed unless otherwise documented below in the visit note. 

## 2020-12-10 NOTE — Patient Instructions (Addendum)
STOP Escitalopram  Go back on sertraline  Okay to take clonazepam as needed for anxiety, twice daily.  Watch for excessive drowsiness.  If this regimen is not working, please make an appointment in 4 to 5 weeks, virtual is okay     GO TO [place] back for a physical exam in 3 months

## 2020-12-10 NOTE — Progress Notes (Signed)
Subjective:    Patient ID: David Lowery, male    DOB: 1971/10/14, 50 y.o.   MRN: [id number]  DOS:  12/10/2020 Type of visit - description: Acute  No history of palpitations, they are actually getting somewhat worse, having episodes every other day. His main concern is after the palpitations, he feels on edge the rest of the day and is really ruining his QOL b/c he gets nervous just thinking about the palpitations could come back.  He is doing the best to have a healthy lifestyle, has lost weight, has stopped caffeine.   Wt Readings from Last 3 Encounters:  12/10/20 188 lb (85.3 kg)  11/23/20 190 lb 2 oz (86.2 kg)  09/02/20 206 lb 2 oz (93.5 kg)     Review of Systems No syncope  Past Medical History:  Diagnosis Date  . Annual physical exam 08/12/2014  . Anxiety   . Asthma due to seasonal allergies   . Dyspnea on exertion 02/09/2020  . Hypertriglyceridemia 02/09/2020  . Inguinal hernia 05/07/2014  . 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 OSA (obstructive sleep apnea)    initial sx: palpitations-fatigue--s/p surgery, much improved   . Palpitations 02/09/2020  . PCP NOTES >>>>>>>>>>>>>>>> 03/28/2016  . Seasonal allergies     Past Surgical History:  Procedure Laterality Date  . CHOLECYSTECTOMY N/A 12/06/2016   Procedure: LAPAROSCOPIC CHOLECYSTECTOMY WITH INTRAOPERATIVE CHOLANGIOGRAM;  Surgeon: Autumn Messing III, MD;  Location: [facility];  Service: General;  Laterality: N/A;  . MASS EXCISION N/A 08/31/2017   Procedure: MINOR EXCISION SEBACEOUS CYST ON BACK X 2;  Surgeon: Jovita Kussmaul, MD;  Location: [facility];  Service: General;  Laterality: N/A;  . THROAT SURGERY  2011   tonsilectomy, uvulectomy d/t OSA     Allergies as of 12/10/2020      Reactions   Sudafed [pseudoephedrine] Anxiety, Palpitations      Medication List       Accurate as of December 10, 2020 10:51 AM. If you have any questions, ask your nurse or doctor.        albuterol 108 (90  Base) MCG/ACT inhaler Commonly known as: Ventolin HFA Inhale 2 puffs into the lungs every 6 (six) hours as needed for wheezing or shortness of breath.   aspirin EC 81 MG tablet Take 1 tablet (81 mg total) by mouth daily. Swallow whole.   escitalopram 20 MG tablet Commonly known as: LEXAPRO Take 1 tablet (20 mg total) by mouth daily.   fexofenadine 180 MG tablet Commonly known as: ALLEGRA Take 180 mg by mouth every other day.   levocetirizine 5 MG tablet Commonly known as: XYZAL Take 5 mg by mouth every other day.   mometasone 50 MCG/ACT nasal spray Commonly known as: NASONEX Place 2 sprays into the nose daily as needed.          Objective:   Physical Exam BP (!) 142/90 (BP Location: Right Arm, Patient Position: Sitting, Cuff Size: Small)   Pulse 76   Temp 98 F (36.7 C) (Oral)   Resp 18   Ht 6' (1.829 m)   Wt 188 lb (85.3 kg)   SpO2 98%   BMI 25.50 kg/m  General:   Well developed, NAD, BMI noted. HEENT:  Normocephalic . Face symmetric, atraumatic Skin: Not pale. Not jaundice Neurologic:  alert & oriented X3.  Speech normal, gait appropriate for age and unassisted Psych--  Cognition and judgment appear intact.  Cooperative with normal attention span and concentration.  Behavior  appropriate. No anxious or depressed appearing.      Assessment     Assessment OSA: Had a remote s/p uvulectomy, improved symptoms.  Home sleep study 07-2020: +, Mild Anxiety Allergies, bronchospasm Hernia L , saw surgery 2015, offered surgery Actinic keratosis, BCC (02/2020); Nevus  excisions 2015 CV: Palpitations: Monitor short run of SVT, MRI heart 03/2020 Noncompaction.  Zio patch 08-2020: Some PVCs, sxs not correlating with an EKG  PLAN Palpitations, Cardiac noncompaction: Saw Endo 11/23/2020, labs-w/u for pheochromocytoma: (-) Zio patch showed few PVCs that not correlated with symptomatology. Anxiety: The patient continue with palpitations, symptoms are not typical in the  morning, sometimes qod.  The palpitations  "ruin the rest of my day" because he gets really anxious and on edge, thinking that he could have another episode. He is suggested to go back on sertraline b/c it  worked for years, also wonders about Cymbalta and prn meds We agreed on the following: Stop Lexapro, go back on sertraline 100 mg, clonazepam twice daily as needed.  Risk of somnolence, addiction discussed. Let me know if does not work RTC 3 months CPX   This visit occurred during the SARS-CoV-2 public health emergency.  Safety protocols were in place, including screening questions prior to the visit, additional usage of staff PPE, and extensive cleaning of exam room while observing appropriate contact time as indicated for disinfecting solutions.

## 2020-12-12 NOTE — Assessment & Plan Note (Signed)
Palpitations, Cardiac noncompaction: Saw Endo 11/23/2020, labs-w/u for pheochromocytoma: (-) Zio patch showed few PVCs that not correlated with symptomatology. Anxiety: The patient continue with palpitations, symptoms are not typical in the morning, sometimes qod.  The palpitations  "ruin the rest of my day" because he gets really anxious and on edge, thinking that he could have another episode. He is suggested to go back on sertraline b/c it  worked for years, also wonders about Cymbalta and prn meds We agreed on the following: Stop Lexapro, go back on sertraline 100 mg, clonazepam twice daily as needed.  Risk of somnolence, addiction discussed. Let me know if does not work RTC 3 months CPX

## 2020-12-13 DIAGNOSIS — J3089 Other allergic rhinitis: Secondary | ICD-10-CM | POA: Diagnosis not present

## 2020-12-13 DIAGNOSIS — J3081 Allergic rhinitis due to animal (cat) (dog) hair and dander: Secondary | ICD-10-CM | POA: Diagnosis not present

## 2020-12-13 DIAGNOSIS — J301 Allergic rhinitis due to pollen: Secondary | ICD-10-CM | POA: Diagnosis not present

## 2020-12-15 DIAGNOSIS — L209 Atopic dermatitis, unspecified: Secondary | ICD-10-CM | POA: Diagnosis not present

## 2020-12-19 DIAGNOSIS — G4733 Obstructive sleep apnea (adult) (pediatric): Secondary | ICD-10-CM | POA: Diagnosis not present

## 2020-12-27 DIAGNOSIS — J3089 Other allergic rhinitis: Secondary | ICD-10-CM | POA: Diagnosis not present

## 2020-12-27 DIAGNOSIS — J301 Allergic rhinitis due to pollen: Secondary | ICD-10-CM | POA: Diagnosis not present

## 2020-12-27 DIAGNOSIS — J3081 Allergic rhinitis due to animal (cat) (dog) hair and dander: Secondary | ICD-10-CM | POA: Diagnosis not present

## 2020-12-31 ENCOUNTER — Encounter: Payer: Self-pay | Admitting: Internal Medicine

## 2021-01-02 ENCOUNTER — Other Ambulatory Visit: Payer: Self-pay | Admitting: Internal Medicine

## 2021-01-16 DIAGNOSIS — G4733 Obstructive sleep apnea (adult) (pediatric): Secondary | ICD-10-CM | POA: Diagnosis not present

## 2021-01-17 DIAGNOSIS — J3089 Other allergic rhinitis: Secondary | ICD-10-CM | POA: Diagnosis not present

## 2021-01-17 DIAGNOSIS — J3081 Allergic rhinitis due to animal (cat) (dog) hair and dander: Secondary | ICD-10-CM | POA: Diagnosis not present

## 2021-01-17 DIAGNOSIS — J301 Allergic rhinitis due to pollen: Secondary | ICD-10-CM | POA: Diagnosis not present

## 2021-01-19 ENCOUNTER — Encounter: Payer: Self-pay | Admitting: Neurology

## 2021-01-19 ENCOUNTER — Ambulatory Visit (INDEPENDENT_AMBULATORY_CARE_PROVIDER_SITE_OTHER): Payer: BC Managed Care – PPO | Admitting: Neurology

## 2021-01-19 VITALS — BP 137/81 | HR 67 | Ht 72.0 in | Wt 189.0 lb

## 2021-01-19 DIAGNOSIS — G4733 Obstructive sleep apnea (adult) (pediatric): Secondary | ICD-10-CM | POA: Diagnosis not present

## 2021-01-19 DIAGNOSIS — Z9989 Dependence on other enabling machines and devices: Secondary | ICD-10-CM | POA: Diagnosis not present

## 2021-01-19 NOTE — Patient Instructions (Signed)
It was good to see you again! I am glad to hear you have adjusted well to your AutoPap and you are compliant with treatment.  I am happy to hear that you have benefited from treatment as well.  Please keep up the good work!  Congratulations on your weight loss success as well.  Please follow-up routinely in this clinic to see one of our nurse practitioners in one year.  Call us or email [place] through [place] if you have any interim questions or concerns.

## 2021-01-19 NOTE — Progress Notes (Signed)
Subjective:    Patient ID: David Lowery is a 50 y.o. male.  HPI     Interim history:   David Lowery is a very pleasant 50 year old right-handed gentleman with an underlying medical history of asthma, allergies, anxiety, palpitations and mildly overweight state, who presents for follow-up consultation of his obstructive sleep apnea after interim testing and starting AutoPap therapy.  The patient is unaccompanied today.  I first met him at the request of his primary care physician on 07/05/2020, at which time he reported a prior diagnosis of sleep apnea and difficulty tolerating CPAP.  He had undergone sleep apnea surgery with UPPP.  He was advised to proceed for reevaluation purposes with a sleep study.  He had a home sleep test on 08/02/2020 which indicated overall mild obstructive sleep apnea, AHI was 14/h, O2 nadir 89%.  He was encouraged to start a trial of AutoPap therapy.  His set up date was 11/18/2020.  Today, 01/19/2021: I reviewed his AutoPap compliance data from the past 30 days from 12/20/2020 through 01/18/2021, which is a total of 30 days, during which time he used his machine 26 days with percent used days greater than 4 hours at 70%, indicating adequate compliance, average usage for days on treatment of 5 hours and 27 minutes, residual AHI at goal at 1.7/h, central apnea index less than 1/h, no significant leak, 95th percentile of pressure at 8.5 cm.  He reports doing well.  He had initial challenges in adjusting to treatment which is understandable, especially with prior difficulty tolerating CPAP in the past.  He has done rather well.  He noticed a high leak when he originally started with a large fullface mask but leak improved tremendously once he switched to a medium full facemask, ResMed F 20 in particular.  He feels that his symptoms have improved, specifically, he has not woken up nearly as many times with a sense of palpitations, anxiety and panic.  This is a great improvement and  certainly motivation for him to continue with treatment.  He has been working on weight loss and has been able to lose nearly 20 pounds in the past few months.  He has history of allergies and gets allergy shots on a regular basis.  From the allergy standpoint he is doing well as well, no recent medication changes or changes in his medical history.    [name] allergies, current medications, family history, past medical history, past social history, past surgical history and problem list were reviewed and updated as appropriate.    Previously:   07/05/20: (He) was previously diagnosed with obstructive sleep apnea several years ago.  Sleep study testing was in 2006 or thereabouts, prior sleep study results are not available for my review today.  He had UPPP in 2006 or 2007 after which his snoring greatly improved.  He has started having some recurrence of snoring the past year.  Weight has generally been stable.  In the past 6 months he has woken up infrequently with palpitations.  He denies any chest pain or shortness of breath or acid reflux symptoms or gasping sensations at night but has woken up with a sense of rapid heartbeat, he has retracted with his smart phone and it was in the 120s when he generally is in the 60s while asleep. Some 6 months ago his palpitations happen maybe once a month and now it is more frequent in the past few weeks and few months.  He denies any recurrent morning headaches  or night to night nocturia or family history of sleep apnea.  He does have a strong family history of anxiety.  He has been on Lexapro with success.  His asthma is under good control and he gets allergy shots and takes an antihistamine on a regular basis since moving to [place] from [place].  When he was on CPAP in the past he had trouble tolerating the full facemask.  He is a mouth breather and likes to sleep on his stomach or sides and had difficulty tolerating the CPAP mask.   His Epworth  sleepiness score is 6 out of 24, fatigue severity score is 21 out of 63.  He has seen cardiology.  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  He works second shift.  Bedtime is generally around 1 AM and his typical rise time is 8 AM.  He works Tuesdays through Saturdays.  He lives with his wife and 18-year-old daughter.  They have no pets in the house.  He has no TV in the bedroom.  He drinks caffeine in the form of soda, 1 or 2/day on average.  He drinks alcohol rarely to none.  He is a non-smoker.  His Past Medical History Is Significant For: Past Medical History:  Diagnosis Date  . Annual physical exam 08/12/2014  . Anxiety   . Asthma due to seasonal allergies   . Dyspnea on exertion 02/09/2020  . Hypertriglyceridemia 02/09/2020  . Inguinal hernia 05/07/2014  . 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 OSA (obstructive sleep apnea)    initial sx: palpitations-fatigue--s/p surgery, much improved   . Palpitations 02/09/2020  . PCP NOTES >>>>>>>>>>>>>>>> 03/28/2016  . Seasonal allergies     His Past Surgical History Is Significant For: Past Surgical History:  Procedure Laterality Date  . CHOLECYSTECTOMY N/A 12/06/2016   Procedure: LAPAROSCOPIC CHOLECYSTECTOMY WITH INTRAOPERATIVE CHOLANGIOGRAM;  Surgeon: Autumn Messing III, MD;  Location: [facility];  Service: General;  Laterality: N/A;  . MASS EXCISION N/A 08/31/2017   Procedure: MINOR EXCISION SEBACEOUS CYST ON BACK X 2;  Surgeon: Jovita Kussmaul, MD;  Location: [facility];  Service: General;  Laterality: N/A;  . THROAT SURGERY  2011   tonsilectomy, uvulectomy d/t OSA     His Family History Is Significant For: Family History  Problem Relation Age of Onset  . Hyperlipidemia Brother        bro, mother, GM  . Colon cancer Other        GM at age 82  . Diabetes Other        aunt  . CAD Neg Hx   . Stroke Neg Hx   . Prostate cancer Neg Hx     His Social History Is Significant For: Social History    Socioeconomic History  . Marital status: Married    Spouse name: Not on file  . Number of children: 1  . Years of education: Not on file  . Highest education level: Not on file  Occupational History  . Occupation: Pharmacologist: Geneticist, molecular    Comment: [employer]  Tobacco Use  . Smoking status: Never Smoker  . Smokeless tobacco: Never Used  Substance and Sexual Activity  . Alcohol use: Yes    Comment: Less than 2 drinks per month   . Drug use: No  . Sexual activity: Not on file  Other Topics Concern  . Not on file  Social History Narrative   Moved to  [place] July 2014, from [employer] [employer]   Son born ~ 2012   Social Determinants of Health   Financial Resource Strain: Not on file  Food Insecurity: Not on file  Transportation Needs: Not on file  Physical Activity: Not on file  Stress: Not on file  Social Connections: Not on file    His Allergies Are:  Allergies  Allergen Reactions  . Sudafed [Pseudoephedrine] Anxiety and Palpitations  :   His Current Medications Are:  Outpatient Encounter Medications as of 01/19/2021  Medication Sig  . albuterol (VENTOLIN HFA) 108 (90 Base) MCG/ACT inhaler Inhale 2 puffs into the lungs every 6 (six) hours as needed for wheezing or shortness of breath.  Marland Kitchen aspirin EC 81 MG tablet Take 1 tablet (81 mg total) by mouth daily. Swallow whole.  . clonazePAM (KLONOPIN) 0.25 MG disintegrating tablet Take 1 tablet (0.25 mg total) by mouth 2 (two) times daily.  . fexofenadine (ALLEGRA) 180 MG tablet Take 180 mg by mouth every other day.  . levocetirizine (XYZAL) 5 MG tablet Take 5 mg by mouth every other day.  . mometasone (NASONEX) 50 MCG/ACT nasal spray Place 2 sprays into the nose daily as needed.  . sertraline (ZOLOFT) 100 MG tablet Take 1 tablet (100 mg total) by mouth daily.   No facility-administered encounter medications on file as of 01/19/2021.  :  Review of Systems:  Out of a complete 14 point review of  systems, all are reviewed and negative with the exception of these symptoms as listed below: Review of Systems  Neurological:       Here for f/u on autopap start. Reports he has been doing well with using machine. Feels better since starting.     Objective:  Neurological Exam  Physical Exam Physical Examination:   Vitals:   01/19/21 0829  BP: 137/81  Pulse: 67    General Examination: The patient is a very pleasant 50 y.o. male in no acute distress. He appears well-developed and well-nourished and well groomed.  Good spirits.  HEENT: Normocephalic, atraumatic, pupils are equal, round and reactive to light, extraocular tracking is well-preserved.  Hearing grossly intact, face is symmetric with normal facial animation.  Speech is clear without dysarthria, hypophonia or voice tremor.   Airway examination reveals stable findings.  Status post UPPP, status post tonsillectomy.  Tongue present centrally and palate elevates symmetrically.    Chest: Clear to auscultation without wheezing, rhonchi or crackles noted.  Heart: S1+S2+0, regular and normal without murmurs, rubs or gallops noted.   Abdomen: Soft, non-tender and non-distended.  Extremities: There is no pitting edema in the distal lower extremities bilaterally.   Skin: Warm and dry without trophic changes noted.   Musculoskeletal: exam reveals no obvious joint deformities.   Neurologically:  Mental status: The patient is awake, alert and oriented in all 4 spheres. His immediate and remote memory, attention, language skills and fund of knowledge are appropriate. There is no evidence of aphasia, agnosia, apraxia or anomia. Speech is clear with normal prosody and enunciation. Thought process is linear. Mood is normal and affect is normal.  Cranial nerves II - XII are as described above under HEENT exam.  Motor exam: Normal bulk, strength and tone is noted. There is no tremor, Romberg is negative. Fine motor skills and  coordination: grossly intact.  Cerebellar testing: No dysmetria or intention tremor. There is no truncal or gait ataxia.  Sensory exam: intact to light touch in the upper and lower extremities.  Gait, station and balance: He stands easily. No veering to one side is noted. No leaning to one side is noted. Posture is age-appropriate and stance is narrow based. Gait shows normal stride length and normal pace. No problems turning are noted. Tandem walk is unremarkable.                Assessment and Plan:  In summary, David Lowery is a very pleasant 50 year old male with an underlying medical history of asthma, allergies, anxiety, palpitations and mildly overweight state, who presents for follow-up consultation of his obstructive sleep apnea which was deemed to be in the mild range by home sleep testing on 08/03/2019 he had significant symptoms with respect to his sleep, particularly nighttime anxiety and panic, palpitations.  These have improved tremendously after starting AutoPap therapy.  He is compliant with treatment and benefits from it, is highly motivated to continue with it at this time.  He has been working on weight loss and has lost a significant amount of weight as well.  He is commended on his weight loss endeavor and success as well as treatment adherence with his AutoPap machine.  He has a lunar machine.  He is advised to follow-up routinely in this clinic in 1 year to see one of our nurse practitioners.  Should he have any interim questions or concerns, he is advised to call us or email [place] through [facility].  He is reminded to be mindful of the supply replacements for his machine including filter, mask insert, headgear, tubing, and water chamber.  He is using fresh water in the humidifier chamber every night.  We went over his test results and reviewed his compliance data in detail.  I answered all his questions today and he was in agreement with the plan.   I spent 30 minutes in total  face-to-face time and in reviewing records during pre-charting, more than 50% of which was spent in counseling and coordination of care, reviewing test results, reviewing medications and treatment regimen and/or in discussing or reviewing the diagnosis of OSA on CPAP, the prognosis and treatment options. Pertinent laboratory and imaging test results that were available during this visit with the patient were reviewed by me and considered in my medical decision making (see chart for details).

## 2021-01-26 ENCOUNTER — Encounter: Payer: Self-pay | Admitting: Cardiology

## 2021-01-26 ENCOUNTER — Ambulatory Visit (INDEPENDENT_AMBULATORY_CARE_PROVIDER_SITE_OTHER): Payer: BC Managed Care – PPO | Admitting: Cardiology

## 2021-01-26 ENCOUNTER — Other Ambulatory Visit: Payer: Self-pay

## 2021-01-26 VITALS — BP 124/80 | HR 78 | Ht 72.0 in | Wt 186.0 lb

## 2021-01-26 DIAGNOSIS — R002 Palpitations: Secondary | ICD-10-CM

## 2021-01-26 DIAGNOSIS — R0600 Dyspnea, unspecified: Secondary | ICD-10-CM | POA: Diagnosis not present

## 2021-01-26 DIAGNOSIS — F419 Anxiety disorder, unspecified: Secondary | ICD-10-CM

## 2021-01-26 DIAGNOSIS — I428 Other cardiomyopathies: Secondary | ICD-10-CM

## 2021-01-26 DIAGNOSIS — G4733 Obstructive sleep apnea (adult) (pediatric): Secondary | ICD-10-CM | POA: Diagnosis not present

## 2021-01-26 DIAGNOSIS — R0609 Other forms of dyspnea: Secondary | ICD-10-CM

## 2021-01-26 NOTE — Patient Instructions (Signed)
Medication Instructions:  Your physician recommends that you continue on your current medications as directed. Please refer to the Current Medication list given to you today.  *If you need a refill on your cardiac medications before your next appointment, please call your pharmacy*   Lab Work: None. If you have labs (blood work) drawn today and your tests are completely normal, you will receive your results only by: . MyChart Message (if you have MyChart) OR . A paper copy in the mail If you have any lab test that is abnormal or we need to change your treatment, we will call you to review the results.   Testing/Procedures: Your physician has requested that you have an echocardiogram. Echocardiography is a painless test that uses sound waves to create images of your heart. It provides your doctor with information about the size and shape of your heart and how well your heart's chambers and valves are working. This procedure takes approximately one hour. There are no restrictions for this procedure.     Follow-Up: At [facility], you and your health needs are our priority.  As part of our continuing mission to provide you with exceptional heart care, we have created designated Provider Care Teams.  These Care Teams include your primary Cardiologist (physician) and Advanced Practice Providers (APPs -  Physician Assistants and Nurse Practitioners) who all work together to provide you with the care you need, when you need it.  We recommend signing up for the patient portal called "MyChart".  Sign up information is provided on this After Visit Summary.  MyChart is used to connect with patients for Virtual Visits (Telemedicine).  Patients are able to view lab/test results, encounter notes, upcoming appointments, etc.  Non-urgent messages can be sent to your provider as well.   To learn more about what you can do with MyChart, go to [email or web address].    Your next appointment:   6  month(s)  The format for your next appointment:   In Person  Provider:   Robert Krasowski, MD   Other Instructions   Echocardiogram An echocardiogram is a test that uses sound waves (ultrasound) to produce images of the heart. Images from an echocardiogram can provide important information about:  Heart size and shape.  The size and thickness and movement of your heart's walls.  Heart muscle function and strength.  Heart valve function or if you have stenosis. Stenosis is when the heart valves are too narrow.  If blood is flowing backward through the heart valves (regurgitation).  A tumor or infectious growth around the heart valves.  Areas of heart muscle that are not working well because of poor blood flow or injury from a heart attack.  Aneurysm detection. An aneurysm is a weak or damaged part of an artery wall. The wall bulges out from the normal force of blood pumping through the body. Tell a health care provider about:  Any allergies you have.  All medicines you are taking, including vitamins, herbs, eye drops, creams, and over-the-counter medicines.  Any blood disorders you have.  Any surgeries you have had.  Any medical conditions you have.  Whether you are pregnant or may be pregnant. What are the risks? Generally, this is a safe test. However, problems may occur, including an allergic reaction to dye (contrast) that may be used during the test. What happens before the test? No specific preparation is needed. You may eat and drink normally. What happens during the test?  You will take off your   clothes from the waist up and put on a hospital gown.  Electrodes or electrocardiogram (ECG)patches may be placed on your chest. The electrodes or patches are then connected to a device that monitors your heart rate and rhythm.  You will lie down on a table for an ultrasound exam. A gel will be applied to your chest to help sound waves pass through your skin.  A  handheld device, called a transducer, will be pressed against your chest and moved over your heart. The transducer produces sound waves that travel to your heart and bounce back (or "echo" back) to the transducer. These sound waves will be captured in real-time and changed into images of your heart that can be viewed on a video monitor. The images will be recorded on a computer and reviewed by your health care provider.  You may be asked to change positions or hold your breath for a short time. This makes it easier to get different views or better views of your heart.  In some cases, you may receive contrast through an IV in one of your veins. This can improve the quality of the pictures from your heart. The procedure may vary among health care providers and hospitals.   What can I expect after the test? You may return to your normal, everyday life, including diet, activities, and medicines, unless your health care provider tells you not to do that. Follow these instructions at home:  It is up to you to get the results of your test. Ask your health care provider, or the department that is doing the test, when your results will be ready.  Keep all follow-up visits. This is important. Summary  An echocardiogram is a test that uses sound waves (ultrasound) to produce images of the heart.  Images from an echocardiogram can provide important information about the size and shape of your heart, heart muscle function, heart valve function, and other possible heart problems.  You do not need to do anything to prepare before this test. You may eat and drink normally.  After the echocardiogram is completed, you may return to your normal, everyday life, unless your health care provider tells you not to do that. This information is not intended to replace advice given to you by your health care provider. Make sure you discuss any questions you have with your health care provider. Document Revised:  06/01/2020 Document Reviewed: 06/01/2020 Elsevier Patient Education  2021 [employer]   

## 2021-01-26 NOTE — Progress Notes (Signed)
Cardiology Office Note:    Date:  01/26/2021   ID:  David Lowery, DOB 05-26-1971, MRN [id number]  PCP:  David Branch, MD  Cardiologist:  David Campus, MD    Referring MD: David Branch, MD   Chief Complaint  Patient presents with  . Follow-up  . Tachycardia    History of Present Illness:    David Lowery is a 50 y.o. 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  He also got some palpitations.  He did monitor which show some sinus tachycardia, majority of palpitation happened at night he was recognized to have significant sleep apnea started treatment for it and palpitation almost completely subsided.  Overall he is doing very well he denies have any chest pain tightness squeezing pressure burning chest no palpitations no dizziness no swelling of lower extremities.  He is on diet he lost some weight he also started exercising on the regular basis doing some mild exercises which make him feel good.  No dizziness no passing out no palpitations  Past Medical History:  Diagnosis Date  . Annual physical exam 08/12/2014  . Anxiety   . Asthma due to seasonal allergies   . Dyspnea on exertion 02/09/2020  . Hypertriglyceridemia 02/09/2020  . Inguinal hernia 05/07/2014  . 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 OSA (obstructive sleep apnea)    initial sx: palpitations-fatigue--s/p surgery, much improved   . Palpitations 02/09/2020  . PCP NOTES >>>>>>>>>>>>>>>> 03/28/2016  . Seasonal allergies     Past Surgical History:  Procedure Laterality Date  . CHOLECYSTECTOMY N/A 12/06/2016   Procedure: LAPAROSCOPIC CHOLECYSTECTOMY WITH INTRAOPERATIVE CHOLANGIOGRAM;  Surgeon: David Messing III, MD;  Location: [facility];  Service: General;  Laterality: N/A;  . MASS EXCISION N/A 08/31/2017   Procedure: MINOR EXCISION SEBACEOUS CYST ON BACK X 2;  Surgeon: David Messing III, MD;   Location: [place];  Service: General;  Laterality: N/A;  . THROAT SURGERY  2011   tonsilectomy, uvulectomy d/t OSA     Current Medications: Current Meds  Medication Sig  . albuterol (VENTOLIN HFA) 108 (90 Base) MCG/ACT inhaler Inhale 2 puffs into the lungs every 6 (six) hours as needed for wheezing or shortness of breath.  Marland Kitchen aspirin EC 81 MG tablet Take 1 tablet (81 mg total) by mouth daily. Swallow whole.  . clonazePAM (KLONOPIN) 0.25 MG disintegrating tablet Take 1 tablet (0.25 mg total) by mouth 2 (two) times daily. (Patient taking differently: Take 0.25 mg by mouth as needed (anxiety).)  . fexofenadine (ALLEGRA) 180 MG tablet Take 180 mg by mouth every other day.  . levocetirizine (XYZAL) 5 MG tablet Take 5 mg by mouth every other day.  . mometasone (NASONEX) 50 MCG/ACT nasal spray Place 2 sprays into the nose daily as needed (allergies).  . sertraline (ZOLOFT) 100 MG tablet Take 1 tablet (100 mg total) by mouth daily.     Allergies:   Sudafed [pseudoephedrine]   Social History   Socioeconomic History  . Marital status: Married    Spouse name: Not on file  . Number of children: 1  . Years of education: Not on file  . Highest education level: Not on file  Occupational History  . Occupation: Pharmacologist: Geneticist, molecular    Comment: [employer]  Tobacco Use  . Smoking status: Never Smoker  . Smokeless  tobacco: Never Used  Substance and Sexual Activity  . Alcohol use: Yes    Comment: Less than 2 drinks per month   . Drug use: No  . Sexual activity: Not on file  Other Topics Concern  . Not on file  Social History Narrative   Moved to [facility] July 2014, from [place]   Son born ~ 2012   Social Determinants of Health   Financial Resource Strain: Not on file  Food Insecurity: Not on file  Transportation Needs: Not on file  Physical Activity: Not on file  Stress: Not on file  Social Connections: Not on file     Family  History: [name] family history includes David cancer in an other family member; Diabetes in an other family member; Hyperlipidemia in his brother. There is no history of CAD, Stroke, or Prostate cancer. ROS:   Please see the history of present illness.    All 14 point review of systems negative except as described per history of present illness  EKGs/Labs/Other Studies Reviewed:      Recent Labs: 11/23/2020: TSH 0.97  Recent Lipid Panel    Component Value Date/Time   CHOL 187 03/05/2018 1334   TRIG 253.0 (H) 03/05/2018 1334   HDL 37.70 (L) 03/05/2018 1334   CHOLHDL 5 03/05/2018 1334   VLDL 50.6 (H) 03/05/2018 1334   LDLCALC 129 (H) 12/03/2015 0937   LDLDIRECT 98.0 03/05/2018 1334    Physical Exam:    VS:  BP 124/80 (BP Location: Right Arm, Patient Position: Sitting)   Pulse 78   Ht 6' (1.829 m)   Wt 186 lb (84.4 kg)   SpO2 96%   BMI 25.23 kg/m     Wt Readings from Last 3 Encounters:  01/26/21 186 lb (84.4 kg)  01/19/21 189 lb (85.7 kg)  12/10/20 188 lb (85.3 kg)     GEN:  Well nourished, well developed in no acute distress HEENT: Normal NECK: No JVD; No carotid bruits LYMPHATICS: No lymphadenopathy CARDIAC: RRR, no murmurs, no rubs, no gallops RESPIRATORY:  Clear to auscultation without rales, wheezing or rhonchi  ABDOMEN: Soft, non-tender, non-distended MUSCULOSKELETAL:  No edema; No deformity  SKIN: Warm and dry LOWER EXTREMITIES: no swelling NEUROLOGIC:  Alert and oriented x 3 PSYCHIATRIC:  Normal affect   ASSESSMENT:    1. Palpitations   2. 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 OSA (obstructive sleep apnea)   4. Dyspnea on exertion   5. Anxiety    PLAN:    In order of problems listed above:  1. Noncompaction which is only mild.  Doing well from that point review echocardiogram will be repeated to make sure there is no progression of the problem.  He is on aspirin again date about aspirin is soft.  We will continue. 2. Capitation's almost  completely subsided since he was recognized with sleep apnea treated appropriately. 3. Obstructive sleep apnea followed by internal medicine team. 4. Dyspnea on exertion doing well from that point review.  Continue present management. 5. Anxiety stable.   Medication Adjustments/Labs and Tests Ordered: Current medicines are reviewed at length with the patient today.  Concerns regarding medicines are outlined above.  No orders of the defined types were placed in this encounter.  Medication changes: No orders of the defined types were placed in this encounter.   Signed, Park Liter, MD, [facility] 01/26/2021 9:20 AM    [place]

## 2021-01-31 DIAGNOSIS — J3081 Allergic rhinitis due to animal (cat) (dog) hair and dander: Secondary | ICD-10-CM | POA: Diagnosis not present

## 2021-01-31 DIAGNOSIS — J301 Allergic rhinitis due to pollen: Secondary | ICD-10-CM | POA: Diagnosis not present

## 2021-01-31 DIAGNOSIS — J3089 Other allergic rhinitis: Secondary | ICD-10-CM | POA: Diagnosis not present

## 2021-02-14 DIAGNOSIS — J3089 Other allergic rhinitis: Secondary | ICD-10-CM | POA: Diagnosis not present

## 2021-02-14 DIAGNOSIS — J3081 Allergic rhinitis due to animal (cat) (dog) hair and dander: Secondary | ICD-10-CM | POA: Diagnosis not present

## 2021-02-14 DIAGNOSIS — J301 Allergic rhinitis due to pollen: Secondary | ICD-10-CM | POA: Diagnosis not present

## 2021-02-16 DIAGNOSIS — G4733 Obstructive sleep apnea (adult) (pediatric): Secondary | ICD-10-CM | POA: Diagnosis not present

## 2021-02-28 ENCOUNTER — Ambulatory Visit (HOSPITAL_BASED_OUTPATIENT_CLINIC_OR_DEPARTMENT_OTHER)
Admission: RE | Admit: 2021-02-28 | Discharge: 2021-02-28 | Disposition: A | Discharge status: Home / Self Care | Payer: BC Managed Care – PPO | Source: Ambulatory Visit | Attending: Cardiology | Admitting: Cardiology

## 2021-02-28 ENCOUNTER — Other Ambulatory Visit: Payer: Self-pay

## 2021-02-28 DIAGNOSIS — I428 Other cardiomyopathies: Secondary | ICD-10-CM | POA: Insufficient documentation

## 2021-02-28 HISTORY — PX: TRANSTHORACIC ECHOCARDIOGRAM: SHX275

## 2021-02-28 LAB — ECHOCARDIOGRAM COMPLETE
AR max vel: 3.53 cm2
AV Area VTI: 3.49 cm2
AV Area mean vel: 3.39 cm2
AV Mean grad: 4 mmHg
AV Peak grad: 7.8 mmHg
Ao pk vel: 1.4 m/s
Area-P 1/2: 4.96 cm2
Calc EF: 61.8 %
S' Lateral: 3.48 cm
Single Plane A2C EF: 56.1 %
Single Plane A4C EF: 66.7 %

## 2021-03-03 ENCOUNTER — Telehealth: Payer: Self-pay | Admitting: Cardiology

## 2021-03-03 NOTE — Telephone Encounter (Signed)
Encounter not needed nurse picked up 

## 2021-03-15 DIAGNOSIS — J3081 Allergic rhinitis due to animal (cat) (dog) hair and dander: Secondary | ICD-10-CM | POA: Diagnosis not present

## 2021-03-15 DIAGNOSIS — J301 Allergic rhinitis due to pollen: Secondary | ICD-10-CM | POA: Diagnosis not present

## 2021-03-15 DIAGNOSIS — J3089 Other allergic rhinitis: Secondary | ICD-10-CM | POA: Diagnosis not present

## 2021-03-18 DIAGNOSIS — G4733 Obstructive sleep apnea (adult) (pediatric): Secondary | ICD-10-CM | POA: Diagnosis not present

## 2021-03-29 ENCOUNTER — Encounter: Payer: Self-pay | Admitting: Internal Medicine

## 2021-03-29 ENCOUNTER — Other Ambulatory Visit: Payer: Self-pay

## 2021-03-29 ENCOUNTER — Ambulatory Visit (INDEPENDENT_AMBULATORY_CARE_PROVIDER_SITE_OTHER): Payer: BC Managed Care – PPO | Admitting: Internal Medicine

## 2021-03-29 VITALS — BP 126/72 | HR 71 | Temp 98.2°F | Resp 16 | Ht 72.0 in | Wt 187.5 lb

## 2021-03-29 DIAGNOSIS — F419 Anxiety disorder, unspecified: Secondary | ICD-10-CM | POA: Diagnosis not present

## 2021-03-29 DIAGNOSIS — Z0000 Encounter for general adult medical examination without abnormal findings: Secondary | ICD-10-CM

## 2021-03-29 DIAGNOSIS — Z1159 Encounter for screening for other viral diseases: Secondary | ICD-10-CM

## 2021-03-29 DIAGNOSIS — Z79899 Other long term (current) drug therapy: Secondary | ICD-10-CM

## 2021-03-29 DIAGNOSIS — E781 Pure hyperglyceridemia: Secondary | ICD-10-CM

## 2021-03-29 LAB — HEMOGLOBIN A1C: Hgb A1c MFr Bld: 5.5 % (ref 4.6–6.5)

## 2021-03-29 NOTE — Patient Instructions (Signed)
  GO TO THE LAB : Get the blood work and provide a urine sample   GO TO [place], [place] back for   a physical exam in 1 year

## 2021-03-29 NOTE — Progress Notes (Signed)
Subjective:    Patient ID: David Lowery, male    DOB: July 17, 1971, 50 y.o.   MRN: [id number]  DOS:  03/29/2021 Type of visit - description:  CPX  Since the last office visit he is doing great. Has no major concerns. Saw cardiology, had an echo, chart reviewed.  Review of Systems Allergy, asthma: Hardly ever has any problems.  Other than above, a 14 point review of systems is negative      Past Medical History:  Diagnosis Date  . Annual physical exam 08/12/2014  . Anxiety   . Asthma due to seasonal allergies   . Dyspnea on exertion 02/09/2020  . Hypertriglyceridemia 02/09/2020  . Inguinal hernia 05/07/2014  . 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 OSA (obstructive sleep apnea)    initial sx: palpitations-fatigue--s/p surgery, much improved   . Palpitations 02/09/2020  . PCP NOTES >>>>>>>>>>>>>>>> 03/28/2016  . Seasonal allergies     Past Surgical History:  Procedure Laterality Date  . CHOLECYSTECTOMY N/A 12/06/2016   Procedure: LAPAROSCOPIC CHOLECYSTECTOMY WITH INTRAOPERATIVE CHOLANGIOGRAM;  Surgeon: Autumn Messing III, MD;  Location: [facility];  Service: General;  Laterality: N/A;  . MASS EXCISION N/A 08/31/2017   Procedure: MINOR EXCISION SEBACEOUS CYST ON BACK X 2;  Surgeon: Jovita Kussmaul, MD;  Location: [place];  Service: General;  Laterality: N/A;  . THROAT SURGERY  2011   tonsilectomy, uvulectomy d/t OSA    Family History  Problem Relation Age of Onset  . Hyperlipidemia Brother        bro, mother, GM  . Colon cancer Other        GM at age 48  . Diabetes Other        aunt  . CAD Neg Hx   . Stroke Neg Hx   . Prostate cancer Neg Hx    Social History   Socioeconomic History  . Marital status: Not on file    Spouse name: Not on file  . Number of children: 1  . Years of education: Not on file  . Highest education level: Not on file  Occupational History  . Occupation: Pharmacologist: Geneticist, molecular    Comment: [employer]   Tobacco Use  . Smoking status: Never Smoker  . Smokeless tobacco: Never Used  Substance and Sexual Activity  . Alcohol use: Yes    Comment: Less than 2 drinks per month   . Drug use: No  . Sexual activity: Not on file  Other Topics Concern  . Not on file  Social History Narrative   Moved to [facility] July 2014, from [place] [employer]   Son born ~ 2012   Social Determinants of Health   Financial Resource Strain: Not on file  Food Insecurity: Not on file  Transportation Needs: Not on file  Physical Activity: Not on file  Stress: Not on file  Social Connections: Not on file  Intimate Partner Violence: Not on file     Allergies as of 03/29/2021      Reactions   Sudafed [pseudoephedrine] Anxiety, Palpitations      Medication List       Accurate as of March 29, 2021 11:59 PM. If you have any questions, ask your nurse or doctor.        albuterol 108 (90 Base) MCG/ACT inhaler Commonly known as: Ventolin HFA Inhale 2 puffs into the lungs every 6 (six) hours as needed for wheezing or shortness of breath.  aspirin EC 81 MG tablet Take 1 tablet (81 mg total) by mouth daily. Swallow whole.   clonazePAM 0.25 MG disintegrating tablet Commonly known as: KLONOPIN Take 1 tablet (0.25 mg total) by mouth 2 (two) times daily. What changed:   when to take this  reasons to take this   fexofenadine 180 MG tablet Commonly known as: ALLEGRA Take 180 mg by mouth every other day.   levocetirizine 5 MG tablet Commonly known as: XYZAL Take 5 mg by mouth every other day.   mometasone 50 MCG/ACT nasal spray Commonly known as: NASONEX Place 2 sprays into the nose daily as needed (allergies).   sertraline 100 MG tablet Commonly known as: ZOLOFT Take 1 tablet (100 mg total) by mouth daily.          Objective:   Physical Exam BP 126/72 (BP Location: Left Arm, Patient Position: Sitting, Cuff Size: Small)   Pulse 71   Temp 98.2 F (36.8 C) (Oral)   Resp 16   Ht 6' (1.829 m)    Wt 187 lb 8 oz (85 kg)   SpO2 97%   BMI 25.43 kg/m  General: Well developed, NAD, BMI noted Neck: No  thyromegaly  HEENT:  Normocephalic . Face symmetric, atraumatic Lungs:  CTA B Normal respiratory effort, no intercostal retractions, no accessory muscle use. Heart: RRR,  no murmur.  Abdomen:  Not distended, soft, non-tender. No rebound or rigidity.   Lower extremities: no pretibial edema bilaterally  Skin: Exposed areas without rash. Not pale. Not jaundice Neurologic:  alert & oriented X3.  Speech normal, gait appropriate for age and unassisted Strength symmetric and appropriate for age.  Psych: Cognition and judgment appear intact.  Cooperative with normal attention span and concentration.  Behavior appropriate. No anxious or depressed appearing.     Assessment     Assessment OSA: Had a remote s/p uvulectomy, improved symptoms.  HSE: Ocp2021: +, mild, Rx Cpap Anxiety Allergies, bronchospasm Hernia L , saw surgery 2015, offered surgery Actinic keratosis, BCC (02/2020); Nevus  excisions 2015 Palpitations: Monitor short run of SVT, MRI heart 03/2020 Noncompaction.  Zio patch 08-2020: Some PVCs, sxs not correlating with an EKG Saw Endo February 2022: w/u pheochromocytoma (-)  PLAN Here for CPX OSA: Started auto CPAP, doing great. Anxiety: On sertraline, clonazepam, symptoms are very well controlled, takes clonazepam mostly at night to help sleep.  UDS and contract today, RF as needed Allergies, bronchospasm: Alternates antihistaminics and hardly ever uses albuterol. Noncompaction, mild: Last visit with cardiology 01/26/2021: Low normal EF, overall looks good, Inguinal hernia, L: Seen by surgery 5 years ago, patient reports area remains the same and has no symptoms RTC 1 year.    This visit occurred during the SARS-CoV-2 public health emergency.  Safety protocols were in place, including screening questions prior to the visit, additional usage of staff PPE, and extensive  cleaning of exam room while observing appropriate contact time as indicated for disinfecting solutions.

## 2021-03-30 ENCOUNTER — Encounter: Payer: Self-pay | Admitting: Internal Medicine

## 2021-03-30 LAB — DRUG MONITORING, PANEL 8 WITH CONFIRMATION, URINE
6 Acetylmorphine: NEGATIVE ng/mL (ref <10)
Alcohol Metabolites: NEGATIVE ng/mL
Amphetamines: NEGATIVE ng/mL (ref <500)
Benzodiazepines: NEGATIVE ng/mL (ref <100)
Buprenorphine, Urine: NEGATIVE ng/mL (ref <5)
Cocaine Metabolite: NEGATIVE ng/mL (ref <150)
Creatinine: 149.3 mg/dL
MDMA: NEGATIVE ng/mL (ref <500)
Marijuana Metabolite: NEGATIVE ng/mL (ref <20)
Opiates: NEGATIVE ng/mL (ref <100)
Oxidant: NEGATIVE ug/mL
Oxycodone: NEGATIVE ng/mL (ref <100)
pH: 7 (ref 4.5–9.0)

## 2021-03-30 LAB — COMPREHENSIVE METABOLIC PANEL
ALT: 54 U/L — ABNORMAL HIGH (ref 0–53)
AST: 24 U/L (ref 0–37)
Albumin: 4.6 g/dL (ref 3.5–5.2)
Alkaline Phosphatase: 68 U/L (ref 39–117)
BUN: 13 mg/dL (ref 6–23)
CO2: 27 mEq/L (ref 19–32)
Calcium: 9.6 mg/dL (ref 8.4–10.5)
Chloride: 103 mEq/L (ref 96–112)
Creatinine, Ser: 0.78 mg/dL (ref 0.40–1.50)
GFR: 105.8 mL/min (ref >60.00)
Glucose, Bld: 90 mg/dL (ref 70–99)
Potassium: 4.1 mEq/L (ref 3.5–5.1)
Sodium: 140 mEq/L (ref 135–145)
Total Bilirubin: 1.1 mg/dL (ref 0.2–1.2)
Total Protein: 7.1 g/dL (ref 6.0–8.3)

## 2021-03-30 LAB — HEPATITIS C ANTIBODY
Hepatitis C Ab: NONREACTIVE
SIGNAL TO CUT-OFF: 0 (ref <1.00)

## 2021-03-30 LAB — DM TEMPLATE

## 2021-03-30 NOTE — Assessment & Plan Note (Signed)
Td 2017 COVID VAX x2, booster rec  CCS:  Never had a colonoscopy, 3 modalities discussed, we agreed to do I fob this year and consider colonoscopy next year Labs:  Reviewed, will get a FLP, A1c, hep C, Ifob, UDS Diet and exercise: Counseled

## 2021-03-30 NOTE — Assessment & Plan Note (Signed)
Here for CPX OSA: Started auto CPAP, doing great. Anxiety: On sertraline, clonazepam, symptoms are very well controlled, takes clonazepam mostly at night to help sleep.  UDS and contract today, RF as needed Allergies, bronchospasm: Alternates antihistaminics and hardly ever uses albuterol. Noncompaction, mild: Last visit with cardiology 01/26/2021: Low normal EF, overall looks good, Inguinal hernia, L: Seen by surgery 5 years ago, patient reports area remains the same and has no symptoms RTC 1 year.

## 2021-04-01 ENCOUNTER — Other Ambulatory Visit: Payer: Self-pay | Admitting: Internal Medicine

## 2021-04-06 NOTE — Addendum Note (Signed)
Addended byDamita Dunnings D on: 04/06/2021 04:56 PM   Modules accepted: Orders

## 2021-04-12 DIAGNOSIS — J3081 Allergic rhinitis due to animal (cat) (dog) hair and dander: Secondary | ICD-10-CM | POA: Diagnosis not present

## 2021-04-12 DIAGNOSIS — J301 Allergic rhinitis due to pollen: Secondary | ICD-10-CM | POA: Diagnosis not present

## 2021-04-12 DIAGNOSIS — J3089 Other allergic rhinitis: Secondary | ICD-10-CM | POA: Diagnosis not present

## 2021-04-14 ENCOUNTER — Other Ambulatory Visit (INDEPENDENT_AMBULATORY_CARE_PROVIDER_SITE_OTHER): Payer: BC Managed Care – PPO

## 2021-04-14 ENCOUNTER — Other Ambulatory Visit: Payer: Self-pay

## 2021-04-14 DIAGNOSIS — Z0000 Encounter for general adult medical examination without abnormal findings: Secondary | ICD-10-CM | POA: Diagnosis not present

## 2021-04-14 DIAGNOSIS — E781 Pure hyperglyceridemia: Secondary | ICD-10-CM | POA: Diagnosis not present

## 2021-04-14 LAB — CBC WITH DIFFERENTIAL/PLATELET
Basophils Absolute: 0.1 10*3/uL (ref 0.0–0.1)
Basophils Relative: 1 % (ref 0.0–3.0)
Eosinophils Absolute: 0.3 10*3/uL (ref 0.0–0.7)
Eosinophils Relative: 5.1 % — ABNORMAL HIGH (ref 0.0–5.0)
HCT: 43 % (ref 39.0–52.0)
Hemoglobin: 14.9 g/dL (ref 13.0–17.0)
Lymphocytes Relative: 35.1 % (ref 12.0–46.0)
Lymphs Abs: 1.8 10*3/uL (ref 0.7–4.0)
MCHC: 34.6 g/dL (ref 30.0–36.0)
MCV: 93.7 fl (ref 78.0–100.0)
Monocytes Absolute: 0.4 10*3/uL (ref 0.1–1.0)
Monocytes Relative: 7.4 % (ref 3.0–12.0)
Neutro Abs: 2.7 10*3/uL (ref 1.4–7.7)
Neutrophils Relative %: 51.4 % (ref 43.0–77.0)
Platelets: 192 10*3/uL (ref 150.0–400.0)
RBC: 4.59 Mil/uL (ref 4.22–5.81)
RDW: 12.4 % (ref 11.5–15.5)
WBC: 5.2 10*3/uL (ref 4.0–10.5)

## 2021-04-14 LAB — FECAL OCCULT BLOOD, IMMUNOCHEMICAL: Fecal Occult Bld: NEGATIVE

## 2021-04-14 LAB — LIPID PANEL
Cholesterol: 156 mg/dL (ref 0–200)
HDL: 45 mg/dL (ref >39.00)
LDL Cholesterol: 97 mg/dL (ref 0–99)
NonHDL: 110.85
Total CHOL/HDL Ratio: 3
Triglycerides: 71 mg/dL (ref 0.0–149.0)
VLDL: 14.2 mg/dL (ref 0.0–40.0)

## 2021-04-14 NOTE — Addendum Note (Signed)
Addended by: Manuela Schwartz on: 04/14/2021 08:32 AM   Modules accepted: Orders

## 2021-04-18 DIAGNOSIS — G4733 Obstructive sleep apnea (adult) (pediatric): Secondary | ICD-10-CM | POA: Diagnosis not present

## 2021-05-10 DIAGNOSIS — J3081 Allergic rhinitis due to animal (cat) (dog) hair and dander: Secondary | ICD-10-CM | POA: Diagnosis not present

## 2021-05-10 DIAGNOSIS — J3089 Other allergic rhinitis: Secondary | ICD-10-CM | POA: Diagnosis not present

## 2021-05-10 DIAGNOSIS — J301 Allergic rhinitis due to pollen: Secondary | ICD-10-CM | POA: Diagnosis not present

## 2021-05-18 DIAGNOSIS — G4733 Obstructive sleep apnea (adult) (pediatric): Secondary | ICD-10-CM | POA: Diagnosis not present

## 2021-06-01 ENCOUNTER — Encounter: Payer: Self-pay | Admitting: Internal Medicine

## 2021-06-01 ENCOUNTER — Ambulatory Visit (INDEPENDENT_AMBULATORY_CARE_PROVIDER_SITE_OTHER): Payer: BC Managed Care – PPO | Admitting: Internal Medicine

## 2021-06-01 ENCOUNTER — Other Ambulatory Visit: Payer: Self-pay

## 2021-06-01 VITALS — BP 126/74 | HR 67 | Temp 98.1°F | Resp 16 | Ht 72.0 in | Wt 194.2 lb

## 2021-06-01 DIAGNOSIS — M25562 Pain in left knee: Secondary | ICD-10-CM

## 2021-06-01 DIAGNOSIS — F419 Anxiety disorder, unspecified: Secondary | ICD-10-CM | POA: Diagnosis not present

## 2021-06-01 MED ORDER — CLONAZEPAM 0.25 MG PO TBDP: 0.2500 mg | ORAL_TABLET | Freq: Two times a day (BID) | ORAL | 1 refills | Status: DC | PRN

## 2021-06-01 NOTE — Progress Notes (Signed)
Subjective:    Patient ID: David Lowery, male    DOB: Jul 26, 1971, 50 y.o.   MRN: [id number]  DOS:  06/01/2021 Type of visit - description: Acute  Left knee pain for about 3 to 4 weeks. Located on the inner aspect of the knee. Recalls no injury. The area has not been swollen or warm. 5 days ago, he was sitting "[place] position" and he suddenly felt a pop on that area and noted a lump.  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   OSA (obstructive sleep apnea)    initial sx: palpitations-fatigue--s/p surgery, much improved    Palpitations 02/09/2020   PCP NOTES >>>>>>>>>>>>>>>> 03/28/2016   Seasonal allergies     Past Surgical History:  Procedure Laterality Date   CHOLECYSTECTOMY N/A 12/06/2016   Procedure: LAPAROSCOPIC CHOLECYSTECTOMY WITH INTRAOPERATIVE CHOLANGIOGRAM;  Surgeon: Autumn Messing III, MD;  Location: [place];  Service: General;  Laterality: N/A;   MASS EXCISION N/A 08/31/2017   Procedure: MINOR EXCISION SEBACEOUS CYST ON BACK X 2;  Surgeon: Jovita Kussmaul, MD;  Location: [place];  Service: General;  Laterality: N/A;   THROAT SURGERY  2011   tonsilectomy, uvulectomy d/t OSA     Allergies as of 06/01/2021       Reactions   Sudafed [pseudoephedrine] Anxiety, Palpitations        Medication List        Accurate as of June 01, 2021 11:59 PM. If you have any questions, ask your nurse or doctor.          albuterol 108 (90 Base) MCG/ACT inhaler Commonly known as: Ventolin HFA Inhale 2 puffs into the lungs every 6 (six) hours as needed for wheezing or shortness of breath.   aspirin EC 81 MG tablet Take 1 tablet (81 mg total) by mouth daily. Swallow whole.   clonazePAM 0.25 MG disintegrating  tablet Commonly known as: KLONOPIN Take 1 tablet (0.25 mg total) by mouth 2 (two) times daily as needed for seizure. What changed:  when to take this reasons to take this Changed by: Kathlene November, MD   fexofenadine 180 MG tablet Commonly known as: ALLEGRA Take 180 mg by mouth every other day.   levocetirizine 5 MG tablet Commonly known as: XYZAL Take 5 mg by mouth every other day.   mometasone 50 MCG/ACT nasal spray Commonly known as: NASONEX Place 2 sprays into the nose daily as needed (allergies).   sertraline 100 MG tablet Commonly known as: ZOLOFT Take 1 tablet (100 mg total) by mouth daily.           Objective:   Physical Exam Musculoskeletal:       Legs:   BP 126/74 (BP Location: Left Arm, Patient Position: Sitting, Cuff Size: Small)   Pulse 67   Temp 98.1 F (36.7 C) (Oral)   Resp 16   Ht 6' (1.829 m)   Wt 194 lb 4 oz (88.1 kg)   SpO2 97%   BMI 26.35 kg/m  General:   Well developed, NAD, BMI noted. HEENT:  Normocephalic . Face symmetric, atraumatic MSK: R knee normal L knee: No effusion, no deformity, no warmth, range of motion normal.  See graphic Skin: Not pale. Not jaundice  Neurologic:  alert & oriented X3.  Speech normal, gait appropriate for age and unassisted Psych--  Cognition and judgment appear intact.  Cooperative with normal attention span and concentration.  Behavior appropriate. No anxious or depressed appearing.      Assessment      Assessment OSA: Had a remote s/p uvulectomy, improved symptoms.  HSE: Ocp2021: +, mild, Rx Cpap Anxiety Allergies, bronchospasm Hernia L , saw surgery 2015, offered surgery Actinic keratosis, BCC (02/2020); Nevus  excisions 2015 Palpitations: Monitor short run of SVT, MRI heart 03/2020 Noncompaction.  Zio patch 08-2020: Some PVCs, sxs not correlating with an EKG Saw Endo February 2022: w/u pheochromocytoma (-)  PLAN Left knee pain: As described above, exam is normal except for small lump on the  inner aspect possibly synovial cyst. No clinical suspicion for a major internal derangement. D/w pt referral versus conservative treatment (ice, Tylenol, ibuprofen, see AVS). Elected conservative treatment, he will call in a couple of weeks if no better. Anxiety: Well-controlled clonazepam, PDMP reviewed and okay, RF sent  This visit occurred during the SARS-CoV-2 public health emergency.  Safety protocols were in place, including screening questions prior to the visit, additional usage of staff PPE, and extensive cleaning of exam room while observing appropriate contact time as indicated for disinfecting solutions.

## 2021-06-01 NOTE — Patient Instructions (Signed)
ICE at night x 1 week  Tylenol  500 mg OTC 2 tabs a day every 8 hours as needed for pain Or IBUPROFEN (Advil or Motrin) 200 mg 2 tablets every 6 hours as needed for pain.  Always take it with food because may cause gastritis and ulcers.  If you notice nausea, stomach pain, change in the color of stools --->  Stop the medicine and let us know   Call if no better in 2 weeks

## 2021-06-02 NOTE — Assessment & Plan Note (Signed)
Left knee pain: As described above, exam is normal except for small lump on the inner aspect possibly synovial cyst. No clinical suspicion for a major internal derangement. D/w pt referral versus conservative treatment (ice, Tylenol, ibuprofen, see AVS). Elected conservative treatment, he will call in a couple of weeks if no better. Anxiety: Well-controlled clonazepam, PDMP reviewed and okay, RF sent

## 2021-06-07 DIAGNOSIS — J301 Allergic rhinitis due to pollen: Secondary | ICD-10-CM | POA: Diagnosis not present

## 2021-06-07 DIAGNOSIS — J3089 Other allergic rhinitis: Secondary | ICD-10-CM | POA: Diagnosis not present

## 2021-06-07 DIAGNOSIS — J3081 Allergic rhinitis due to animal (cat) (dog) hair and dander: Secondary | ICD-10-CM | POA: Diagnosis not present

## 2021-06-10 ENCOUNTER — Encounter: Payer: Self-pay | Admitting: Internal Medicine

## 2021-06-10 ENCOUNTER — Other Ambulatory Visit: Payer: Self-pay | Admitting: Internal Medicine

## 2021-06-10 MED ORDER — CLONAZEPAM 0.25 MG PO TBDP: 0.2500 mg | ORAL_TABLET | Freq: Two times a day (BID) | ORAL | 1 refills | Status: DC | PRN

## 2021-06-18 DIAGNOSIS — G4733 Obstructive sleep apnea (adult) (pediatric): Secondary | ICD-10-CM | POA: Diagnosis not present

## 2021-07-05 DIAGNOSIS — J3081 Allergic rhinitis due to animal (cat) (dog) hair and dander: Secondary | ICD-10-CM | POA: Diagnosis not present

## 2021-07-05 DIAGNOSIS — J3089 Other allergic rhinitis: Secondary | ICD-10-CM | POA: Diagnosis not present

## 2021-07-05 DIAGNOSIS — J301 Allergic rhinitis due to pollen: Secondary | ICD-10-CM | POA: Diagnosis not present

## 2021-09-07 ENCOUNTER — Telehealth: Payer: Self-pay | Admitting: Family Medicine

## 2021-09-07 NOTE — Telephone Encounter (Signed)
Pt is requesting TOC to Dr. Anitra Lauth from Dr. Larose Kells. Pt has moved and closer to [facility] now. Please advise.

## 2021-09-07 NOTE — Telephone Encounter (Signed)
Yes okay 

## 2021-09-07 NOTE — Telephone Encounter (Signed)
Please assist patient with scheduling, thanks. 

## 2021-09-07 NOTE — Telephone Encounter (Signed)
Okay with me, thank you 

## 2021-09-12 NOTE — Telephone Encounter (Signed)
LVM for pt to call and schedule TOC to Dr. Anitra Lauth

## 2021-09-13 NOTE — Telephone Encounter (Signed)
Scheduled appt for 12/9

## 2021-09-30 ENCOUNTER — Other Ambulatory Visit: Payer: Self-pay

## 2021-09-30 ENCOUNTER — Encounter: Payer: Self-pay | Admitting: Family Medicine

## 2021-09-30 ENCOUNTER — Ambulatory Visit (INDEPENDENT_AMBULATORY_CARE_PROVIDER_SITE_OTHER): Payer: Self-pay | Admitting: Family Medicine

## 2021-09-30 VITALS — BP 106/68 | HR 66 | Temp 97.8°F | Ht 72.0 in | Wt 198.4 lb

## 2021-09-30 DIAGNOSIS — R002 Palpitations: Secondary | ICD-10-CM

## 2021-09-30 DIAGNOSIS — M7711 Lateral epicondylitis, right elbow: Secondary | ICD-10-CM

## 2021-09-30 DIAGNOSIS — F411 Generalized anxiety disorder: Secondary | ICD-10-CM

## 2021-09-30 MED ORDER — CLONAZEPAM 0.25 MG PO TBDP: 0.2500 mg | ORAL_TABLET | Freq: Two times a day (BID) | ORAL | 1 refills | Status: DC

## 2021-09-30 NOTE — Progress Notes (Signed)
Office Note 09/30/2021  CC:  Chief Complaint  Patient presents with   Establish Care   Elbow Pain    Started 3-4 weeks ago, no meds used for relief   HPI:  David Lowery is a 50 y.o. male who is here to transfer/establish care. Patient's most recent primary MD: Dr. Larose Kells, [facility]. Old records in EPIC/HL EMR were reviewed prior to or during today's visit.  Pt's last cpe was done 03/29/21.  All labs normal at that time.  IFOB was neg.  David Lowery is doing well other than about 3 weeks history of pain in the lateral aspect of the right elbow. Hurts to supinate forearm and extend wrist.  No preceding overuse/repetitive motion.  No injury.  No medications tried.  PMP AWARE reviewed today: most recent rx for clonazepam was filled 06/10/21, # 38, rx by Dr. Larose Kells. No red flags.  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   OSA on CPAP    initial sx: palpitations-fatigue--s/p surgery, much improved   Palpitations 02/09/2020   Endo 11/2020-->pheo w/u NEG   Seasonal allergies     Past Surgical History:  Procedure Laterality Date   Cardiac MRI  04/12/2020   noncompaction of vent myocardium   CHOLECYSTECTOMY N/A 12/06/2016   Procedure: LAPAROSCOPIC CHOLECYSTECTOMY WITH INTRAOPERATIVE CHOLANGIOGRAM;  Surgeon: Autumn Messing III, MD;  Location: [facility];  Service: General;  Laterality: N/A;   MASS EXCISION N/A 08/31/2017   Procedure: MINOR EXCISION SEBACEOUS CYST ON BACK X 2;  Surgeon: Jovita Kussmaul, MD;  Location: [facility];  Service: General;  Laterality: N/A;   THROAT SURGERY  1982   tonsilectomy, uvulectomy d/t OSA    TRANSTHORACIC ECHOCARDIOGRAM  02/28/2021   EF 50-55%, mild LA dilation, o/w normal.   zio patch  08/2020   some pvc's o/w normal    Family History  Problem Relation  Age of Onset   High Cholesterol Mother    Lung cancer Father    Drug abuse Father    Non-Hodgkin's lymphoma Sister    Hyperlipidemia Brother        bro, mother, GM   Colon cancer Maternal Grandmother    Alcohol abuse Paternal Grandfather    Colon cancer Other        GM at age 40   Diabetes Other        aunt   CAD Neg Hx    Stroke Neg Hx    Prostate cancer Neg Hx     Social History   Socioeconomic History   Marital status: Married    Spouse name: Not on file   Number of children: 1   Years of education: Not on file   Highest education level: Not on file  Occupational History   Occupation: Pharmacologist: [employer]    Comment: [employer]  Tobacco Use   Smoking status: Never   Smokeless tobacco: Never  Substance and Sexual Activity   Alcohol use: Yes    Comment: Less than 2 drinks per month    Drug use: No   Sexual activity: Not on file  Other Topics Concern   Not on file  Social History Narrative   Married, Son born ~ 2012.   Moved to [facility] July 2014, from [place]  Some college/trade school.     Occupation: Public librarian   No tobacco or drugs.      Social Determinants of Health   Financial Resource Strain: Not on file  Food Insecurity: Not on file  Transportation Needs: Not on file  Physical Activity: Not on file  Stress: Not on file  Social Connections: Not on file  Intimate Partner Violence: Not on file    Outpatient Encounter Medications as of 09/30/2021  Medication Sig   albuterol (VENTOLIN HFA) 108 (90 Base) MCG/ACT inhaler Inhale 2 puffs into the lungs every 6 (six) hours as needed for wheezing or shortness of breath.   aspirin EC 81 MG tablet Take 1 tablet (81 mg total) by mouth daily. Swallow whole.   clonazePAM (KLONOPIN) 0.25 MG disintegrating tablet Take 1 tablet (0.25 mg total) by mouth 2 (two) times daily as needed for seizure.   fexofenadine (ALLEGRA) 180 MG tablet Take 180 mg by mouth every other day.    levocetirizine (XYZAL) 5 MG tablet Take 5 mg by mouth every other day.   sertraline (ZOLOFT) 100 MG tablet Take 1 tablet (100 mg total) by mouth daily.   mometasone (NASONEX) 50 MCG/ACT nasal spray Place 2 sprays into the nose daily as needed (allergies). (Patient not taking: Reported on 09/30/2021)   No facility-administered encounter medications on file as of 09/30/2021.    Allergies  Allergen Reactions   Sudafed [Pseudoephedrine] Anxiety and Palpitations    ROS Review of Systems  Constitutional:  Negative for appetite change, chills, fatigue and fever.  HENT:  Negative for congestion, dental problem, ear pain and sore throat.   Eyes:  Negative for discharge, redness and visual disturbance.  Respiratory:  Negative for cough, chest tightness, shortness of breath and wheezing.   Cardiovascular:  Negative for chest pain, palpitations and leg swelling.  Gastrointestinal:  Negative for abdominal pain, blood in stool, diarrhea, nausea and vomiting.  Genitourinary:  Negative for difficulty urinating, dysuria, flank pain, frequency, hematuria and urgency.  Musculoskeletal:  Positive for arthralgias (right elbow). Negative for back pain, joint swelling, myalgias and neck stiffness.  Skin:  Negative for pallor and rash.  Neurological:  Negative for dizziness, speech difficulty, weakness and headaches.  Hematological:  Negative for adenopathy. Does not bruise/bleed easily.  Psychiatric/Behavioral:  Negative for confusion and sleep disturbance. The patient is not nervous/anxious.    PE; Blood pressure 106/68, pulse 66, temperature 97.8 F (36.6 C), temperature source Oral, height 6' (1.829 m), weight 198 lb 6.4 oz (90 kg), SpO2 96 %.Body mass index is 26.91 kg/m.  Gen: Alert, well appearing.  Patient is oriented to person, place, time, and situation. AFFECT: pleasant, lucid thought and speech. CV: RRR, no m/r/g.   LUNGS: CTA bilat, nonlabored resps, good aeration in all lung fields. EXT: no  clubbing or cyanosis.  no edema.  Right elbow: No erythema, warmth, or swelling.  He has focal tenderness over the lateral epicondyle and proximal extensor tendon complex.  He has mild pain with resisted supination of the forearm and resisted extension of the wrist.  Pertinent labs:  Lab Results  Component Value Date   TSH 0.97 11/23/2020   Lab Results  Component Value Date   WBC 5.2 04/14/2021   HGB 14.9 04/14/2021   HCT 43.0 04/14/2021   MCV 93.7 04/14/2021   PLT 192.0 04/14/2021   Lab Results  Component Value Date   CREATININE 0.78 03/29/2021   BUN 13 03/29/2021   NA 140 03/29/2021   K 4.1  03/29/2021   CL 103 03/29/2021   CO2 27 03/29/2021   Lab Results  Component Value Date   ALT 54 (H) 03/29/2021   AST 24 03/29/2021   ALKPHOS 68 03/29/2021   BILITOT 1.1 03/29/2021   Lab Results  Component Value Date   CHOL 156 04/14/2021   Lab Results  Component Value Date   HDL 45.00 04/14/2021   Lab Results  Component Value Date   LDLCALC 97 04/14/2021   Lab Results  Component Value Date   TRIG 71.0 04/14/2021   Lab Results  Component Value Date   CHOLHDL 3 04/14/2021   Lab Results  Component Value Date   HGBA1C 5.5 03/29/2021   ASSESSMENT AND PLAN:   New patient/transfer care.  1.  Right elbow lateral epicondylitis.  Discussed conservative measures including tennis elbow strap/band, as needed NSAID therapy, relative rest.  Bedside ultrasound today showed the tendon fully intact and no hyperemia.  #2 palpitations.  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  His symptoms are limited.  He chose not to start the calcium channel blocker prescribed by his cardiologist.  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  He gets fairly regular cardiology follow-up for all of this.  3.  Anxiety, anxiety related insomnia.  He only occasionally takes clonazepam.  He takes Zoloft 100 mg a day and does well.  An After Visit Summary was printed and given to the  patient.  No follow-ups on file.  Signed:  Crissie Sickles, MD           09/30/2021

## 2021-11-23 ENCOUNTER — Encounter: Payer: Self-pay | Admitting: Cardiology

## 2021-11-23 ENCOUNTER — Other Ambulatory Visit: Payer: Self-pay

## 2021-11-23 ENCOUNTER — Ambulatory Visit (INDEPENDENT_AMBULATORY_CARE_PROVIDER_SITE_OTHER): Payer: 59 | Admitting: Cardiology

## 2021-11-23 VITALS — BP 116/64 | HR 72 | Ht 72.0 in | Wt 200.0 lb

## 2021-11-23 DIAGNOSIS — I428 Other cardiomyopathies: Secondary | ICD-10-CM | POA: Diagnosis not present

## 2021-11-23 DIAGNOSIS — R002 Palpitations: Secondary | ICD-10-CM

## 2021-11-23 DIAGNOSIS — G4733 Obstructive sleep apnea (adult) (pediatric): Secondary | ICD-10-CM | POA: Diagnosis not present

## 2021-11-23 NOTE — Progress Notes (Signed)
Cardiology Office Note:    Date:  11/23/2021   ID:  David Lowery, DOB 26-Mar-1971, MRN [id number]  PCP:  Colon Branch, MD  Cardiologist:  Jenne Campus, MD    Referring MD: Colon Branch, MD   No chief complaint on file. I am doing well  History of Present Illness:    David Lowery is a 51 y.o. male   past medical history significant for noncompaction which was recognized about a year ago she he did have MRI showing ratio of noncompaction to compaction 2.341 by MRI.  He also got some palpitations.  He did monitor which show some sinus tachycardia, majority of palpitation happened at night he was recognized to have significant sleep apnea started treatment for it and palpitation almost completely subsided He comes today 2 months of follow-up overall he is doing very well.  He denies have any chest pain tightness squeezing pressure burning chest.  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  He is enjoying his job tremendously very rare palpitations but no dizziness no passing out.  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   OSA on CPAP    initial sx: palpitations-fatigue--s/p surgery, much improved   Palpitations 02/09/2020   Endo 11/2020-->pheo w/u NEG   Seasonal allergies     Past Surgical History:  Procedure Laterality Date   Cardiac MRI  04/12/2020   noncompaction of vent myocardium   CHOLECYSTECTOMY N/A 12/06/2016   Procedure: LAPAROSCOPIC CHOLECYSTECTOMY WITH INTRAOPERATIVE CHOLANGIOGRAM;  Surgeon: Autumn Messing III, MD;  Location: [facility];  Service: General;  Laterality: N/A;   MASS EXCISION N/A 08/31/2017   Procedure: MINOR EXCISION SEBACEOUS CYST ON BACK X 2;  Surgeon: Jovita Kussmaul, MD;  Location: [facility];  Service: General;  Laterality: N/A;    THROAT SURGERY  1982   tonsilectomy, uvulectomy d/t OSA    TRANSTHORACIC ECHOCARDIOGRAM  02/28/2021   EF 50-55%, mild LA dilation, o/w normal.   zio patch  08/2020   some pvc's o/w normal    Current Medications: Current Meds  Medication Sig   albuterol (VENTOLIN HFA) 108 (90 Base) MCG/ACT inhaler Inhale 2 puffs into the lungs every 6 (six) hours as needed for wheezing or shortness of breath.   aspirin EC 81 MG tablet Take 1 tablet (81 mg total) by mouth daily. Swallow whole.   clonazePAM (KLONOPIN) 0.25 MG disintegrating tablet Take 1 tablet (0.25 mg total) by mouth 2 (two) times daily.   fexofenadine (ALLEGRA) 180 MG tablet Take 180 mg by mouth every other day.   levocetirizine (XYZAL) 5 MG tablet Take 5 mg by mouth every other day.   mometasone (NASONEX) 50 MCG/ACT nasal spray Place 2 sprays into the nose daily as needed (allergies).   sertraline (ZOLOFT) 100 MG tablet Take 1 tablet (100 mg total) by mouth daily.     Allergies:   Sudafed [pseudoephedrine]   Social History   Socioeconomic History   Marital status: Married    Spouse name: Not on file   Number of children: 1   Years of education: Not on file   Highest education level: Not on file  Occupational History   Occupation: Pharmacologist: [employer]  Tobacco Use   Smoking status: Never    Passive exposure: Never   Smokeless tobacco: Never  Vaping Use   Vaping Use: Never used  Substance and Sexual Activity   Alcohol use: Yes    Comment: Less than 2 drinks per month    Drug use: No   Sexual activity: Not on file  Other Topics Concern   Not on file  Social History Narrative   Married, Son born ~ 2012.   Moved to [facility] July 2014, from [place] college/trade school.     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   No tobacco or drugs.      Social Determinants of Health   Financial Resource Strain: Not on file  Food Insecurity: Not on file   Transportation Needs: Not on file  Physical Activity: Not on file  Stress: Not on file  Social Connections: Not on file     Family History: [name] family history includes Alcohol abuse in his paternal grandfather; Colon cancer in his maternal grandmother and another family member; Diabetes in an other family member; Drug abuse in his father; High Cholesterol in his mother; Hyperlipidemia in his brother; Lung cancer in his father; Non-Hodgkin's lymphoma in his sister. There is no history of CAD, Stroke, or Prostate cancer. ROS:   Please see the history of present illness.    All 14 point review of systems negative except as described per history of present illness  EKGs/Labs/Other Studies Reviewed:      Recent Labs: 11/23/2020: TSH 0.97 03/29/2021: ALT 54; BUN 13; Creatinine, Ser 0.78; Potassium 4.1; Sodium 140 04/14/2021: Hemoglobin 14.9; Platelets 192.0  Recent Lipid Panel    Component Value Date/Time   CHOL 156 04/14/2021 0816   TRIG 71.0 04/14/2021 0816   HDL 45.00 04/14/2021 0816   CHOLHDL 3 04/14/2021 0816   VLDL 14.2 04/14/2021 0816   LDLCALC 97 04/14/2021 0816   LDLDIRECT 98.0 03/05/2018 1334    Physical Exam:    VS:  BP 116/64 (BP Location: Right Arm)    Pulse 72    Ht 6' (1.829 m)    Wt 200 lb (90.7 kg)    SpO2 97%    BMI 27.12 kg/m     Wt Readings from Last 3 Encounters:  11/23/21 200 lb (90.7 kg)  09/30/21 198 lb 6.4 oz (90 kg)  06/01/21 194 lb 4 oz (88.1 kg)     GEN:  Well nourished, well developed in no acute distress HEENT: Normal NECK: No JVD; No carotid bruits LYMPHATICS: No lymphadenopathy CARDIAC: RRR, no murmurs, no rubs, no gallops RESPIRATORY:  Clear to auscultation without rales, wheezing or rhonchi  ABDOMEN: Soft, non-tender, non-distended MUSCULOSKELETAL:  No edema; No deformity  SKIN: Warm and dry LOWER EXTREMITIES: no swelling NEUROLOGIC:  Alert and oriented x 3 PSYCHIATRIC:  Normal affect   ASSESSMENT:    1. Noncompaction  cardiomyopathy ([place])   2. OSA (obstructive sleep apnea)   3. Palpitations    PLAN:    In order of problems listed above:  Noncompaction asymptomatic no dizziness no passing out on aspirin we will continue.  We will repeat echocardiogram in summertime. Obstructive sleep apnea: He does use CPAP on the regular basis however he cannot tell me if it helps him a lot but is committed to keep using it which I encouraged him to do Dyslipidemia I did review his K PN which show me his LDL of 97 HDL 45 is acceptable cholesterol profile  for his risk factors.  We will continue present medications. Ask him to let me know if you have any dizziness or passing out.  I will repeat echocardiogram in the summer we will check fasting lipid profile the time and see him back sometimes in the fall time   Medication Adjustments/Labs and Tests Ordered: Current medicines are reviewed at length with the patient today.  Concerns regarding medicines are outlined above.  No orders of the defined types were placed in this encounter.  Medication changes: No orders of the defined types were placed in this encounter.   Signed, Park Liter, MD, [facility] 11/23/2021 8:19 AM    [place]

## 2021-11-23 NOTE — Patient Instructions (Signed)
Medication Your physician recommends that you continue on your current medications as directed. Please refer to the Current Medication list given to you today.  *If you need a refill on your cardiac medications before your next appointment, please call your pharmacy*   Lab Work: Labs in July: Lipids If you have labs (blood work) drawn today and your tests are completely normal, you will receive your results only by: [place] (if you have MyChart) OR A paper copy in the mail If you have any lab test that is abnormal or we need to change your treatment, we will call you to review the results.   Testing/Procedures: Your physician has requested that you have an echocardiogram. Echocardiography is a painless test that uses sound waves to create images of your heart. It provides your doctor with information about the size and shape of your heart and how well your hearts chambers and valves are working. This procedure takes approximately one hour. There are no restrictions for this procedure.    Follow-Up: At [facility], you and your health needs are our priority.  As part of our continuing mission to provide you with exceptional heart care, we have created designated Provider Care Teams.  These Care Teams include your primary Cardiologist (physician) and Advanced Practice Providers (APPs -  Physician Assistants and Nurse Practitioners) who all work together to provide you with the care you need, when you need it.  We recommend signing up for the patient portal called "MyChart".  Sign up information is provided on this After Visit Summary.  MyChart is used to connect with patients for Virtual Visits (Telemedicine).  Patients are able to view lab/test results, encounter notes, upcoming appointments, etc.  Non-urgent messages can be sent to your provider as well.   To learn more about what you can do with MyChart, go to [email or web address].    Your next appointment:   8  month(s)  The format for your next appointment:   In Person  Provider:   Jenne Campus, MD    Other Instructions None

## 2022-01-03 ENCOUNTER — Encounter: Payer: Self-pay | Admitting: Family Medicine

## 2022-01-03 ENCOUNTER — Other Ambulatory Visit: Payer: Self-pay

## 2022-01-03 MED ORDER — CLONAZEPAM 0.25 MG PO TBDP: 0.2500 mg | ORAL_TABLET | Freq: Two times a day (BID) | ORAL | 1 refills | Status: DC

## 2022-01-03 NOTE — Telephone Encounter (Signed)
Requesting: clonazepam ?Contract: 03/29/21 ?UDS: 03/29/21 ?Last Visit:09/30/21 ?Next Visit: advised to f/u 6 mo ?Last Refill: 09/30/21(60,1) ? ?Please Advise. Med pending ?

## 2022-01-19 ENCOUNTER — Ambulatory Visit (INDEPENDENT_AMBULATORY_CARE_PROVIDER_SITE_OTHER): Payer: Commercial Managed Care - PPO | Admitting: Adult Health

## 2022-01-19 ENCOUNTER — Encounter: Payer: Self-pay | Admitting: Adult Health

## 2022-01-19 VITALS — BP 123/74 | HR 63 | Ht 72.0 in | Wt 199.6 lb

## 2022-01-19 DIAGNOSIS — G4733 Obstructive sleep apnea (adult) (pediatric): Secondary | ICD-10-CM

## 2022-01-19 DIAGNOSIS — Z9989 Dependence on other enabling machines and devices: Secondary | ICD-10-CM | POA: Diagnosis not present

## 2022-01-19 NOTE — Patient Instructions (Signed)
Continue using CPAP nightly and greater than 4 hours each night Mask refitting ordered If your symptoms worsen or you develop new symptoms please let us know.   

## 2022-01-19 NOTE — Progress Notes (Signed)
? ? ?PATIENT: David Lowery ?DOB: 1971-01-14 ? ?REASON FOR VISIT: follow up ?HISTORY FROM: patient ?PRIMARY NEUROLOGIST: Dr. Rexene Alberts ? ?Chief Complaint  ?Patient presents with  ? Follow-up  ?  Pt in 4  pt is here for CPAP follow  pt has no questions or concerns for this visit   ? ? ? ?HISTORY OF PRESENT ILLNESS: ?Today 01/19/22 ? ?David Lowery is a 51 year old male with a history of obstructive sleep apnea on CPAP.  He returns today for follow-up.  CPAP download indicates that he uses machine 23 out of 30 days for compliance of 76%.  He uses machine greater than 4 hours for compliance of only 30%.  On average he uses his machine 2 hours and 42 minutes.  His residual AHI is 2.1 on 5 to 11 cm of water he states that there are some nights he unknowingly takes his CPAP machine off.  If he wakes up and plans to go back to sleep he will put it back on.  He states that he got a new mask but it is still leaking.  He is okay with doing a mask refitting.  He does find the CPAP beneficial.Used to dose off while driving and no longer doing this. ? ?REVIEW OF SYSTEMS: Out of a complete 14 system review of symptoms, the patient complains only of the following symptoms, and all other reviewed systems are negative. ? ? ?ESS 4 ? ?ALLERGIES: ?Allergies  ?Allergen Reactions  ? Sudafed [Pseudoephedrine] Anxiety and Palpitations  ? ? ?HOME MEDICATIONS: ?Outpatient Medications Prior to Visit  ?Medication Sig Dispense Refill  ? albuterol (VENTOLIN HFA) 108 (90 Base) MCG/ACT inhaler Inhale 2 puffs into the lungs every 6 (six) hours as needed for wheezing or shortness of breath. 1 Inhaler 1  ? aspirin EC 81 MG tablet Take 1 tablet (81 mg total) by mouth daily. Swallow whole. 90 tablet 3  ? clonazePAM (KLONOPIN) 0.25 MG disintegrating tablet Take 1 tablet (0.25 mg total) by mouth 2 (two) times daily. 60 tablet 1  ? fexofenadine (ALLEGRA) 180 MG tablet Take 180 mg by mouth every other day.    ? levocetirizine (XYZAL) 5 MG tablet Take 5 mg by  mouth every other day.    ? mometasone (NASONEX) 50 MCG/ACT nasal spray Place 2 sprays into the nose daily as needed (allergies).    ? sertraline (ZOLOFT) 100 MG tablet Take 1 tablet (100 mg total) by mouth daily. 90 tablet 3  ? ?No facility-administered medications prior to visit.  ? ? ?PAST MEDICAL HISTORY: ?Past Medical History:  ?Diagnosis Date  ? Anxiety   ? Asthma due to seasonal allergies   ? Dyspnea on exertion 02/09/2020  ? Hypertriglyceridemia 02/09/2020  ? Inguinal hernia 05/07/2014  ? L  ? 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 OSA on CPAP   ? initial sx: palpitations-fatigue--s/p surgery, much improved  ? Palpitations 02/09/2020  ? Endo 11/2020-->pheo w/u NEG  ? Seasonal allergies   ? ? ?PAST SURGICAL HISTORY: ?Past Surgical History:  ?Procedure Laterality Date  ? Cardiac MRI  04/12/2020  ? noncompaction of vent myocardium  ? CHOLECYSTECTOMY N/A 12/06/2016  ? Procedure: LAPAROSCOPIC CHOLECYSTECTOMY WITH INTRAOPERATIVE CHOLANGIOGRAM;  Surgeon: Autumn Messing III, MD;  Location: [facility];  Service: General;  Laterality: N/A;  ? MASS EXCISION N/A 08/31/2017  ? Procedure: MINOR EXCISION SEBACEOUS CYST ON BACK X 2;  Surgeon: Jovita Kussmaul, MD;  Location: [facility];  Service: General;  Laterality: N/A;  ? THROAT  SURGERY  1982  ? tonsilectomy, uvulectomy d/t OSA   ? TRANSTHORACIC ECHOCARDIOGRAM  02/28/2021  ? EF 50-55%, mild LA dilation, o/w normal.  ? zio patch  08/2020  ? some pvc's o/w normal  ? ? ?FAMILY HISTORY: ?Family History  ?Problem Relation Age of Onset  ? High Cholesterol Mother   ? Lung cancer Father   ? Drug abuse Father   ? Non-Hodgkin's lymphoma Sister   ? Hyperlipidemia Brother   ?     bro, mother, GM  ? Colon cancer Maternal Grandmother   ? Alcohol abuse Paternal Grandfather   ? Colon cancer Other   ?     GM at age 40  ? Diabetes Other   ?     aunt  ? CAD Neg Hx   ? Stroke Neg Hx   ? Prostate cancer Neg Hx   ? Sleep apnea Neg Hx   ? ? ?SOCIAL HISTORY: ?Social History   ? ?Socioeconomic History  ? Marital status: Married  ?  Spouse name: Not on file  ? Number of children: 1  ? Years of education: Not on file  ? Highest education level: Not on file  ?Occupational History  ? Occupation: Engineer, technical sales  ?  Employer: [employer]  ?  Comment: [employer]  ?Tobacco Use  ? Smoking status: Never  ?  Passive exposure: Never  ? Smokeless tobacco: Never  ?Vaping Use  ? Vaping Use: Never used  ?Substance and Sexual Activity  ? Alcohol use: Yes  ?  Comment: Less than 2 drinks per month   ? Drug use: No  ? Sexual activity: Not on file  ?Other Topics Concern  ? Not on file  ?Social History Narrative  ? Married, Son born ~ 2012.  ? Moved to [place] July 2014, from [place]  ? Some college/trade school.    ? 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  ? No tobacco or drugs.  ?   ? ?Social Determinants of Health  ? ?Financial Resource Strain: Not on file  ?Food Insecurity: Not on file  ?Transportation Needs: Not on file  ?Physical Activity: Not on file  ?Stress: Not on file  ?Social Connections: Not on file  ?Intimate Partner Violence: Not on file  ? ? ? ? ?PHYSICAL EXAM ? ?Vitals:  ? 01/19/22 0853  ?BP: 123/74  ?Pulse: 63  ?Weight: 199 lb 9.6 oz (90.5 kg)  ?Height: 6' (1.829 m)  ? ?Body mass index is 27.07 kg/m?. ? ?Generalized: Well developed, in no acute distress  ?Chest: Lungs clear to auscultation bilaterally ? ?Neurological examination  ?Mentation: Alert oriented to time, place, history taking. Follows all commands speech and language fluent ?Cranial nerve II-XII: Extraocular movements were full, visual field were full on confrontational test Head turning and shoulder shrug  were normal and symmetric. ?Motor: The motor testing reveals 5 over 5 strength of all 4 extremities. Good symmetric motor tone is noted throughout.  ?Sensory: Sensory testing is intact to soft touch on all 4 extremities. No evidence of extinction is noted.  ?Gait and station: Gait is normal.  ? ? ?DIAGNOSTIC DATA  (LABS, IMAGING, TESTING) ?- I reviewed patient records, labs, notes, testing and imaging myself where available. ? ?Lab Results  ?Component Value Date  ? WBC 5.2 04/14/2021  ? HGB 14.9 04/14/2021  ? HCT 43.0 04/14/2021  ? MCV 93.7 04/14/2021  ? PLT 192.0 04/14/2021  ? ?   ?Component Value Date/Time  ? NA 140 03/29/2021 1100  ?  K 4.1 03/29/2021 1100  ? CL 103 03/29/2021 1100  ? CO2 27 03/29/2021 1100  ? GLUCOSE 90 03/29/2021 1100  ? BUN 13 03/29/2021 1100  ? CREATININE 0.78 03/29/2021 1100  ? CALCIUM 9.6 03/29/2021 1100  ? PROT 7.1 03/29/2021 1100  ? ALBUMIN 4.6 03/29/2021 1100  ? AST 24 03/29/2021 1100  ? ALT 54 (H) 03/29/2021 1100  ? ALKPHOS 68 03/29/2021 1100  ? BILITOT 1.1 03/29/2021 1100  ? GFRNONAA >60 12/03/2016 0000  ? GFRAA >60 12/03/2016 0000  ? ?Lab Results  ?Component Value Date  ? CHOL 156 04/14/2021  ? HDL 45.00 04/14/2021  ? Russell 97 04/14/2021  ? LDLDIRECT 98.0 03/05/2018  ? TRIG 71.0 04/14/2021  ? CHOLHDL 3 04/14/2021  ? ?Lab Results  ?Component Value Date  ? HGBA1C 5.5 03/29/2021  ? ?No results found for: VITAMINB12 ?Lab Results  ?Component Value Date  ? TSH 0.97 11/23/2020  ? ? ? ? ?ASSESSMENT AND PLAN ?51 y.o. 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 here with: ? ?OSA on CPAP ? ?- CPAP compliance suboptimal ?- Good treatment of AHI  ?-Mask refitting ordered ?- Encourage patient to use CPAP nightly and > 4 hours each night ?- F/U in 1 year or sooner if needed ? ? ? ? ?Ward Givens, MSN, NP-C 01/19/2022, 9:16 AM ?[facility] ?[address], [address] ?[place],  [address] ?[phone] ? ? ?

## 2022-01-23 NOTE — Progress Notes (Signed)
Denyse Amass, RN; Vanessa Ralphs ?got it   ? ?  ?Previous Messages ?  ?----- Message -----  ?From: Brandon Melnick, RN  ?Sent: 01/19/2022   5:01 PM EDT  ?To: [name]  ?Subject: mask refitting                                ? ?New order in Epic on pt  ? ?David Lowery "[name]"  ?Male, 51 y.o., February 06, 1971  ?Pronouns:  ?he/him/his  ?MRN:  ?[id number]  ? ?Thanks Acupuncturist  ? ?

## 2022-03-23 IMAGING — MR MR CARD MORPHOLOGY WO/W CM
45 of 48 series · 45 of 48 positions shown · IV contrast (gadavist)
Comparison: Echo 03/01/20

EXAM:
CARDIAC MRI

CLINICAL DATA: Query noncompaction
TECHNIQUE: The patient was scanned on a 1.5 Tesla GE magnet. A dedicated
cardiac coil was used. Functional imaging was done using Fiest[employer]
sequences. [DATE], and 4 chamber views were done to assess for RWMA's.
Modified Karl rule using a short axis stack was used to
calculate an ejection fraction on a dedicated work station using
Circle software. The patient received 10mL GADAVIST GADOBUTROL 1
MMOL/ML IV SOLN. After 10 minutes inversion recovery sequences were
used to assess for infiltration and scar tissue.

[Series 4: [id number] · axial · 8.0mm · 1.33mm/px · 1 of 16 slices shown]
[im 1/16]
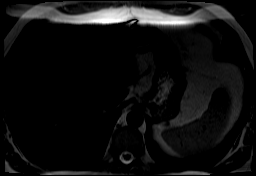

[Series 8: bSSFP · oblique · 8.0mm · 1.61mm/px · 1 of 25 slices shown (1 of 24)]
[im 1/25]
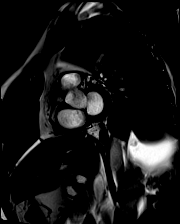

[Series 9: bSSFP · oblique · 8.0mm · 1.61mm/px · 1 of 25 slices shown (2 of 24)]
[im 1/25]
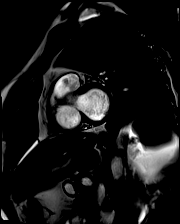

[Series 10: bSSFP · oblique · 8.0mm · 1.61mm/px · 1 of 25 slices shown (3 of 24)]
[im 1/25]
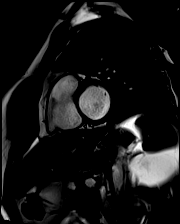

[Series 11: bSSFP · oblique · 8.0mm · 1.61mm/px · 1 of 25 slices shown (4 of 24)]
[im 1/25]
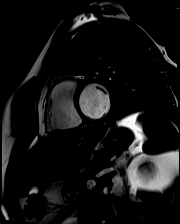

[Series 12: bSSFP · oblique · 8.0mm · 1.61mm/px · 1 of 25 slices shown (5 of 24)]
[im 1/25]
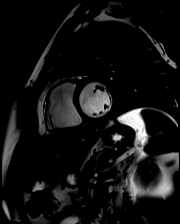

[Series 13: bSSFP · oblique · 8.0mm · 1.61mm/px · 1 of 25 slices shown (6 of 24)]
[im 1/25]
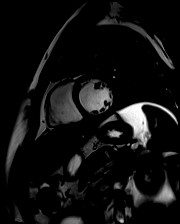

[Series 14: bSSFP · oblique · 8.0mm · 1.61mm/px · 1 of 25 slices shown (7 of 24)]
[im 1/25]
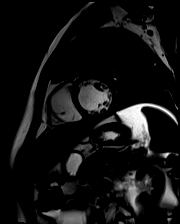

[Series 15: bSSFP · oblique · 8.0mm · 1.61mm/px · 1 of 25 slices shown (8 of 24)]
[im 1/25]
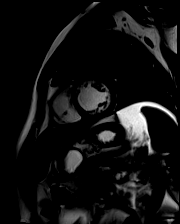

[Series 16: bSSFP · oblique · 8.0mm · 1.61mm/px · 1 of 25 slices shown (9 of 24)]
[im 1/25]
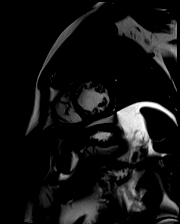

[Series 17: bSSFP · oblique · 8.0mm · 1.61mm/px · 1 of 25 slices shown (10 of 24)]
[im 1/25]
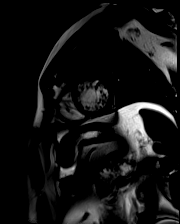

[Series 18: bSSFP · oblique · 8.0mm · 1.61mm/px · 1 of 25 slices shown (11 of 24)]
[im 1/25]
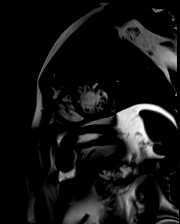

[Series 19: bSSFP · oblique · 8.0mm · 1.61mm/px · 1 of 25 slices shown (12 of 24)]
[im 1/25]
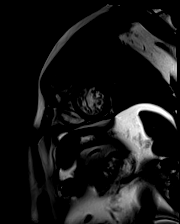

[Series 20: bSSFP · oblique · 8.0mm · 1.61mm/px · 1 of 25 slices shown (13 of 24)]
[im 1/25]
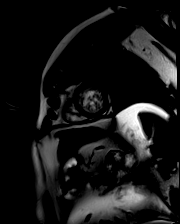

[Series 21: bSSFP · oblique · 8.0mm · 1.61mm/px · 1 of 25 slices shown (14 of 24)]
[im 1/25]
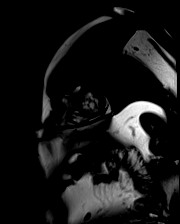

[Series 22: bSSFP · oblique · 8.0mm · 1.61mm/px · 1 of 25 slices shown (15 of 24)]
[im 1/25]
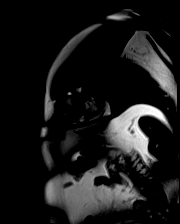

[Series 23: bSSFP · oblique · 8.0mm · 1.61mm/px · 1 of 25 slices shown (16 of 24)]
[im 1/25]
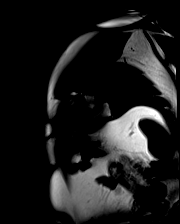

[Series 24: bSSFP · oblique · 8.0mm · 1.61mm/px · 1 of 25 slices shown (17 of 24)]
[im 1/25]
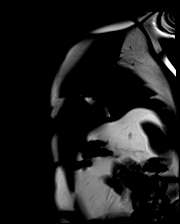

[Series 25: bSSFP · oblique · 8.0mm · 1.61mm/px · 1 of 25 slices shown (18 of 24)]
[im 1/25]
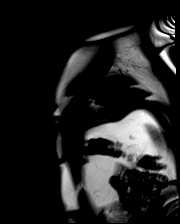

[Series 26: bSSFP · oblique · 8.0mm · 1.61mm/px · 1 of 25 slices shown (19 of 24)]
[im 1/25]
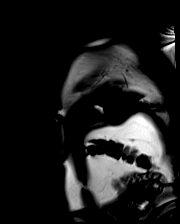

[Series 27: bSSFP · oblique · 8.0mm · 1.61mm/px · 1 of 25 slices shown (20 of 24)]
[im 1/25]
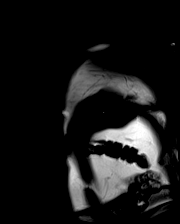

[Series 28: bSSFP · oblique · 8.0mm · 1.61mm/px · 1 of 25 slices shown (21 of 24)]
[im 1/25]
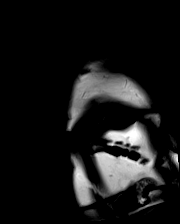

[Series 29: bSSFP · axial · 6.0mm · 1.41mm/px · 1 of 25 slices shown (22 of 24)]
[im 1/25]
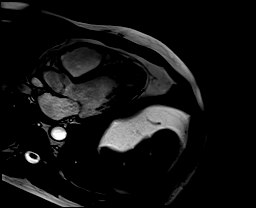

[Series 30: bSSFP · coronal · 6.0mm · 1.41mm/px · 1 of 25 slices shown (23 of 24)]
[im 1/25]
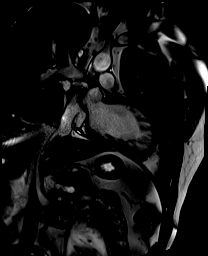

[Series 32: bSSFP · oblique · 6.0mm · 1.41mm/px · 1 of 25 slices shown (24 of 24)]
[im 1/25]
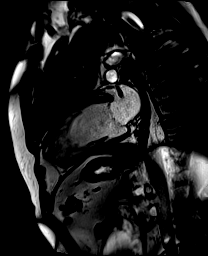

[Series 33: cine real time · axial · 8.0mm · 2.73mm/px · 1 of 20 slices shown (1 of 20)]
[im 1/20]
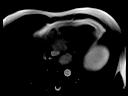

[Series 33: cine real time · axial · 8.0mm · 2.73mm/px · 1 of 20 slices shown (2 of 20)]
[im 1/20]
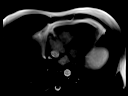

[Series 33: cine real time · axial · 8.0mm · 2.73mm/px · 1 of 20 slices shown (3 of 20)]
[im 1/20]
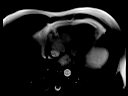

[Series 33: cine real time · axial · 8.0mm · 2.73mm/px · 1 of 20 slices shown (4 of 20)]
[im 1/20]
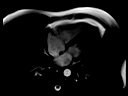

[Series 33: cine real time · axial · 8.0mm · 2.73mm/px · 1 of 20 slices shown (5 of 20)]
[im 1/20]
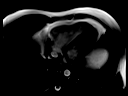

[Series 33: cine real time · axial · 8.0mm · 2.73mm/px · 1 of 20 slices shown (6 of 20)]
[im 1/20]
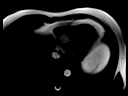

[Series 33: cine real time · axial · 8.0mm · 2.73mm/px · 1 of 20 slices shown (7 of 20)]
[im 1/20]
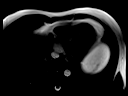

[Series 33: cine real time · axial · 8.0mm · 2.73mm/px · 1 of 20 slices shown (8 of 20)]
[im 1/20]
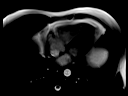

[Series 33: cine real time · axial · 8.0mm · 2.73mm/px · 1 of 20 slices shown (9 of 20)]
[im 1/20]
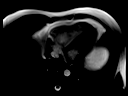

[Series 33: cine real time · axial · 8.0mm · 2.73mm/px · 1 of 20 slices shown (10 of 20)]
[im 1/20]
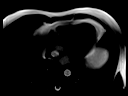

[Series 33: cine real time · axial · 8.0mm · 2.73mm/px · 1 of 20 slices shown (11 of 20)]
[im 1/20]
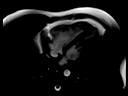

[Series 33: cine real time · axial · 8.0mm · 2.73mm/px · 1 of 20 slices shown (12 of 20)]
[im 1/20]
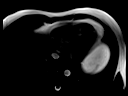

[Series 33: cine real time · axial · 8.0mm · 2.73mm/px · 1 of 20 slices shown (13 of 20)]
[im 1/20]
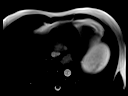

[Series 33: cine real time · axial · 8.0mm · 2.73mm/px · 1 of 20 slices shown (14 of 20)]
[im 1/20]
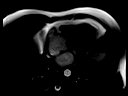

[Series 33: cine real time · axial · 8.0mm · 2.73mm/px · 1 of 20 slices shown (15 of 20)]
[im 1/20]
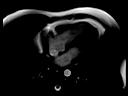

[Series 33: cine real time · axial · 8.0mm · 2.73mm/px · 1 of 20 slices shown (16 of 20)]
[im 1/20]
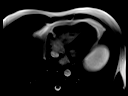

[Series 33: cine real time · axial · 8.0mm · 2.73mm/px · 1 of 20 slices shown (17 of 20)]
[im 1/20]
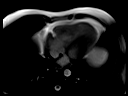

[Series 33: cine real time · axial · 8.0mm · 2.73mm/px · 1 of 20 slices shown (18 of 20)]
[im 1/20]
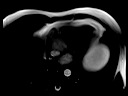

[Series 33: cine real time · axial · 8.0mm · 2.73mm/px · 1 of 20 slices shown (19 of 20)]
[im 1/20]
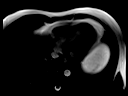

[Series 33: cine real time · axial · 8.0mm · 2.73mm/px · 1 of 20 slices shown (20 of 20)]
[im 1/20]
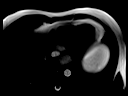

[45 of 48 positions shown; findings below may reference images not displayed]

FINDINGS: LEFT VENTRICLE:

Mildly increased left ventricular chamber size by volumetric
assessment.

Normal left ventricular wall thickness. Maximal wall thickness 8 mm
in the ventricular septal wall.

Normal left ventricular systolic function.

LVEF = 59%

There are no regional wall motion abnormalities.

Ratio of noncompacted to compacted myocardium is [DATE] mm at end
diastole. This is a ratio of [DATE], consistent with a diagnosis of
noncompaction of the left ventricular myocardium.

There is no post contrast delayed myocardial enhancement.

Normal T1 myocardial nulling kinetics suggest against a diagnosis of
cardiac amyloidosis.

ECV = 23%

RIGHT VENTRICLE:

Normal right ventricular chamber size.

Normal right ventricular wall thickness.

Normal right ventricular systolic function.

RVEF = 60%

There are no regional wall motion abnormalities.

No post contrast delayed myocardial enhancement.

ATRIA:

Normal left atrial size.

Mild right atrial enlargement.

VALVES:

No significant valvular abnormalities.

PERICARDIUM:

Normal pericardium.  No pericardial effusion.

OTHER: Mid ascending aorta measures 28 mm at the mid ascending aorta
measured in an axial plane.

No significant extracardiac findings.

MEASUREMENTS:
Left ventricle:

LV male

LV EF: 59 % (Normal 56-78%)

Absolute volumes:

LV EDV: 207 mL (Normal 77-195 mL)

LV ESV: 85 mL (Normal 19-72 mL)

LV SV: 122 mL (Normal 51-133 mL)

CO: 6.6 L/min (Normal 2.8-8.8 L/min)

Indexed volumes:

LV EDV: 95 mL/sq-m (Normal 47-92 mL/sq-m)

LV ESV: 39 mL/sq-m (Normal 13-30 mL/sq-m)

LV SV: 56 mL/sq-m (Normal 32-62 mL/sq-m)

CI: 3.05 L/min/sq-m (Normal 1.7-4.2 L/min/sq-m)

Right ventricle:

RV male

RV EF: 60% (Normal 47-74%)

Absolute volumes:

RV EDV: 176 mL (Normal 88-227 mL)

RV ESV: 71 mL (Normal 23-103 mL)

RV SV: 105 mL (Normal 52-138 mL)

CO: 5.7  L/min (Normal 2.8-8.8 L/min)

Indexed volumes:

RV EDV: 81 mL/sq-m (Normal 55-105 mL/sq-m)

RV ESV: 39 mL/sq-m (Normal 15-43 mL/sq-m)

RV SV: 48 mL/sq-m (Normal 32-64 mL/sq-m)

CI: 2.6 L/min/sq-m (Normal 1.7-4.2 L/min/sq-m)
IMPRESSION: 1. Mildly dilated left ventricular chamber size by volume, normal
left ventricular function, LVEF 59%.

2.  Normal right ventricular size and function, RVEF 60%.

3. Findings consistent with noncompaction of the LV myocardium. LV
apical and lateral wall trabeculations are >[DATE] ratio of
noncompacted to compacted myocardium.

4.  No post-contrast delayed myocardial enhancement.

## 2022-03-28 ENCOUNTER — Ambulatory Visit (HOSPITAL_BASED_OUTPATIENT_CLINIC_OR_DEPARTMENT_OTHER)
Admission: RE | Admit: 2022-03-28 | Discharge: 2022-03-28 | Disposition: A | Discharge status: Home / Self Care | Payer: Commercial Managed Care - PPO | Source: Ambulatory Visit | Attending: Cardiology | Admitting: Cardiology

## 2022-03-28 DIAGNOSIS — I428 Other cardiomyopathies: Secondary | ICD-10-CM | POA: Diagnosis not present

## 2022-03-28 DIAGNOSIS — G4733 Obstructive sleep apnea (adult) (pediatric): Secondary | ICD-10-CM | POA: Diagnosis not present

## 2022-03-28 DIAGNOSIS — R002 Palpitations: Secondary | ICD-10-CM | POA: Insufficient documentation

## 2022-03-28 LAB — ECHOCARDIOGRAM COMPLETE
AR max vel: 2.1 cm2
AV Area VTI: 2.11 cm2
AV Area mean vel: 2.2 cm2
AV Mean grad: 4 mmHg
AV Peak grad: 8 mmHg
Ao pk vel: 1.41 m/s
Area-P 1/2: 3.63 cm2
S' Lateral: 3.2 cm

## 2022-03-28 MED ORDER — PERFLUTREN LIPID MICROSPHERE
1.0000 mL | INTRAVENOUS | Status: AC | PRN
  Administered 2022-03-28: 2 mL via INTRAVENOUS

## 2022-03-28 NOTE — Progress Notes (Signed)
  Echocardiogram 2D Echocardiogram with contrast has been performed.  David Lowery F 03/28/2022, 12:45 PM

## 2022-03-31 ENCOUNTER — Encounter: Payer: Self-pay | Admitting: Family Medicine

## 2022-03-31 ENCOUNTER — Ambulatory Visit (INDEPENDENT_AMBULATORY_CARE_PROVIDER_SITE_OTHER): Payer: Commercial Managed Care - PPO | Admitting: Family Medicine

## 2022-03-31 VITALS — BP 132/79 | HR 62 | Temp 97.8°F | Ht 72.0 in | Wt 201.8 lb

## 2022-03-31 DIAGNOSIS — Z0000 Encounter for general adult medical examination without abnormal findings: Secondary | ICD-10-CM

## 2022-03-31 DIAGNOSIS — Z79899 Other long term (current) drug therapy: Secondary | ICD-10-CM

## 2022-03-31 DIAGNOSIS — F411 Generalized anxiety disorder: Secondary | ICD-10-CM

## 2022-03-31 DIAGNOSIS — Z1211 Encounter for screening for malignant neoplasm of colon: Secondary | ICD-10-CM | POA: Diagnosis not present

## 2022-03-31 LAB — LIPID PANEL
Cholesterol: 199 mg/dL (ref 0–200)
HDL: 43.4 mg/dL (ref >39.00)
LDL Cholesterol: 119 mg/dL — ABNORMAL HIGH (ref 0–99)
NonHDL: 155.93
Total CHOL/HDL Ratio: 5
Triglycerides: 184 mg/dL — ABNORMAL HIGH (ref 0.0–149.0)
VLDL: 36.8 mg/dL (ref 0.0–40.0)

## 2022-03-31 LAB — CBC
HCT: 42.3 % (ref 39.0–52.0)
Hemoglobin: 14.6 g/dL (ref 13.0–17.0)
MCHC: 34.6 g/dL (ref 30.0–36.0)
MCV: 92.8 fl (ref 78.0–100.0)
Platelets: 246 10*3/uL (ref 150.0–400.0)
RBC: 4.56 Mil/uL (ref 4.22–5.81)
RDW: 12.3 % (ref 11.5–15.5)
WBC: 6.9 10*3/uL (ref 4.0–10.5)

## 2022-03-31 LAB — COMPREHENSIVE METABOLIC PANEL
ALT: 49 U/L (ref 0–53)
AST: 25 U/L (ref 0–37)
Albumin: 4.5 g/dL (ref 3.5–5.2)
Alkaline Phosphatase: 77 U/L (ref 39–117)
BUN: 11 mg/dL (ref 6–23)
CO2: 29 mEq/L (ref 19–32)
Calcium: 9.6 mg/dL (ref 8.4–10.5)
Chloride: 99 mEq/L (ref 96–112)
Creatinine, Ser: 0.7 mg/dL (ref 0.40–1.50)
GFR: 108.55 mL/min (ref >60.00)
Glucose, Bld: 87 mg/dL (ref 70–99)
Potassium: 4.2 mEq/L (ref 3.5–5.1)
Sodium: 136 mEq/L (ref 135–145)
Total Bilirubin: 1.1 mg/dL (ref 0.2–1.2)
Total Protein: 7 g/dL (ref 6.0–8.3)

## 2022-03-31 LAB — TSH: TSH: 0.54 u[IU]/mL (ref 0.35–5.50)

## 2022-03-31 NOTE — Patient Instructions (Signed)
Health Maintenance, Male Adopting a healthy lifestyle and getting preventive care are important in promoting health and wellness. Ask your health care provider about: The right schedule for you to have regular tests and exams. Things you can do on your own to prevent diseases and keep yourself healthy. What should I know about diet, weight, and exercise? Eat a healthy diet  Eat a diet that includes plenty of vegetables, fruits, low-fat dairy products, and lean protein. Do not eat a lot of foods that are high in solid fats, added sugars, or sodium. Maintain a healthy weight Body mass index (BMI) is a measurement that can be used to identify possible weight problems. It estimates body fat based on height and weight. Your health care provider can help determine your BMI and help you achieve or maintain a healthy weight. Get regular exercise Get regular exercise. This is one of the most important things you can do for your health. Most adults should: Exercise for at least 150 minutes each week. The exercise should increase your heart rate and make you sweat (moderate-intensity exercise). Do strengthening exercises at least twice a week. This is in addition to the moderate-intensity exercise. Spend less time sitting. Even light physical activity can be beneficial. Watch cholesterol and blood lipids Have your blood tested for lipids and cholesterol at 49 years of age, then have this test every 5 years. You may need to have your cholesterol levels checked more often if: Your lipid or cholesterol levels are high. You are older than 49 years of age. You are at high risk for heart disease. What should I know about cancer screening? Many types of cancers can be detected early and may often be prevented. Depending on your health history and family history, you may need to have cancer screening at various ages. This may include screening for: Colorectal cancer. Prostate cancer. Skin cancer. Lung  cancer. What should I know about heart disease, diabetes, and high blood pressure? Blood pressure and heart disease High blood pressure causes heart disease and increases the risk of stroke. This is more likely to develop in people who have high blood pressure readings or are overweight. Talk with your health care provider about your target blood pressure readings. Have your blood pressure checked: Every 3-5 years if you are 18-39 years of age. Every year if you are 40 years old or older. If you are between the ages of 65 and 75 and are a current or former smoker, ask your health care provider if you should have a one-time screening for abdominal aortic aneurysm (AAA). Diabetes Have regular diabetes screenings. This checks your fasting blood sugar level. Have the screening done: Once every three years after age 45 if you are at a normal weight and have a low risk for diabetes. More often and at a younger age if you are overweight or have a high risk for diabetes. What should I know about preventing infection? Hepatitis B If you have a higher risk for hepatitis B, you should be screened for this virus. Talk with your health care provider to find out if you are at risk for hepatitis B infection. Hepatitis C Blood testing is recommended for: Everyone born from 1945 through 1965. Anyone with known risk factors for hepatitis C. Sexually transmitted infections (STIs) You should be screened each year for STIs, including gonorrhea and chlamydia, if: You are sexually active and are younger than 49 years of age. You are older than 49 years of age and your   health care provider tells you that you are at risk for this type of infection. Your sexual activity has changed since you were last screened, and you are at increased risk for chlamydia or gonorrhea. Ask your health care provider if you are at risk. Ask your health care provider about whether you are at high risk for HIV. Your health care provider  may recommend a prescription medicine to help prevent HIV infection. If you choose to take medicine to prevent HIV, you should first get tested for HIV. You should then be tested every 3 months for as long as you are taking the medicine. Follow these instructions at home: Alcohol use Do not drink alcohol if your health care provider tells you not to drink. If you drink alcohol: Limit how much you have to 0-2 drinks a day. Know how much alcohol is in your drink. In the [place] one drink equals one 12 oz bottle of beer (355 mL), one 5 oz glass of wine (148 mL), or one 1 oz glass of hard liquor (44 mL). Lifestyle Do not use any products that contain nicotine or tobacco. These products include cigarettes, chewing tobacco, and vaping devices, such as e-cigarettes. If you need help quitting, ask your health care provider. Do not use street drugs. Do not share needles. Ask your health care provider for help if you need support or information about quitting drugs. General instructions Schedule regular health, dental, and eye exams. Stay current with your vaccines. Tell your health care provider if: You often feel depressed. You have ever been abused or do not feel safe at home. Summary Adopting a healthy lifestyle and getting preventive care are important in promoting health and wellness. Follow your health care provider's instructions about healthy diet, exercising, and getting tested or screened for diseases. Follow your health care provider's instructions on monitoring your cholesterol and blood pressure. This information is not intended to replace advice given to you by your health care provider. Make sure you discuss any questions you have with your health care provider. Document Revised: 02/28/2021 Document Reviewed: 02/28/2021 Elsevier Patient Education  2023 [employer]  

## 2022-03-31 NOTE — Progress Notes (Signed)
Office Note 03/31/2022  CC:  Chief Complaint  Patient presents with   Anxiety   Annual Exam    Pt is fasting    HPI:  Patient is a 51 y.o. male who is here for annual health maintenance exam and follow-up anxiety with high risk medication use. I last saw him for his initial establish care visit 6 mo ago. A/P as of that visit: "New patient/transfer care.  1.  Right elbow lateral epicondylitis.  Discussed conservative measures including tennis elbow strap/band, as needed NSAID therapy, relative rest.  Bedside ultrasound today showed the tendon fully intact and no hyperemia.   #2 palpitations.  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  His symptoms are limited.  He chose not to start the calcium channel blocker prescribed by his cardiologist.  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  He gets fairly regular cardiology follow-up for all of this.  3.  Anxiety, anxiety related insomnia.  He only occasionally takes clonazepam.  He takes Zoloft 100 mg a day and does well."  INTERIM HX: Doing great. Had a bad URI/cough/conjunctivitis/fever illness a few weeks ago, went to UC, rx'd abx for eyes/oral abx as well. Says just a mild residual cough at this time.  PMP AWARE reviewed today: most recent rx for clonazepam was filled 01/03/2022, #60, rx by me. No red flags.  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   OSA on CPAP    initial sx: palpitations-fatigue--s/p surgery, much improved   Palpitations 02/09/2020   Endo 11/2020-->pheo w/u NEG   Seasonal allergies     Past Surgical History:  Procedure Laterality Date   Cardiac MRI  04/12/2020   noncompaction of vent myocardium   CHOLECYSTECTOMY N/A 12/06/2016   Procedure: LAPAROSCOPIC CHOLECYSTECTOMY WITH INTRAOPERATIVE  CHOLANGIOGRAM;  Surgeon: Autumn Messing III, MD;  Location: [facility];  Service: General;  Laterality: N/A;   MASS EXCISION N/A 08/31/2017   Procedure: MINOR EXCISION SEBACEOUS CYST ON BACK X 2;  Surgeon: Jovita Kussmaul, MD;  Location: [facility];  Service: General;  Laterality: N/A;   THROAT SURGERY  1982   tonsilectomy, uvulectomy d/t OSA    TRANSTHORACIC ECHOCARDIOGRAM  02/28/2021   EF 50-55%, mild LA dilation, o/w normal. 03/28/22 EF 50-55%, mod LA dilat, o/w normal.   zio patch  08/2020   some pvc's o/w normal    Family History  Problem Relation Age of Onset   High Cholesterol Mother    Lung cancer Father    Drug abuse Father    Non-Hodgkin's lymphoma Sister    Hyperlipidemia Brother        bro, mother, GM   Colon cancer Maternal Grandmother    Alcohol abuse Paternal Grandfather    Colon cancer Other        GM at age 90   Diabetes Other        aunt   CAD Neg Hx    Stroke Neg Hx    Prostate cancer Neg Hx    Sleep apnea Neg Hx     Social History   Socioeconomic History   Marital status: Married    Spouse name: Not on file   Number of children: 1   Years of education: Not on file   Highest education level: Not on file  Occupational History   Occupation: Pharmacologist: Geneticist, molecular    Comment: [employer]  Tobacco Use   Smoking status: Never    Passive exposure: Never   Smokeless tobacco: Never  Vaping Use   Vaping Use: Never used  Substance and Sexual Activity   Alcohol use: Yes    Comment: Less than 2 drinks per month    Drug use: No   Sexual activity: Not on file  Other Topics Concern   Not on file  Social History Narrative   Married, Son born ~ 2012.   Moved to [facility] July 2014, from [place] college/trade school.     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   No tobacco or drugs.      Social Determinants of Health   Financial Resource Strain: Not on file  Food Insecurity: Not on file  Transportation Needs: Not on  file  Physical Activity: Not on file  Stress: Not on file  Social Connections: Not on file  Intimate Partner Violence: Not on file    Outpatient Medications Prior to Visit  Medication Sig Dispense Refill   albuterol (VENTOLIN HFA) 108 (90 Base) MCG/ACT inhaler Inhale 2 puffs into the lungs every 6 (six) hours as needed for wheezing or shortness of breath. 1 Inhaler 1   aspirin EC 81 MG tablet Take 1 tablet (81 mg total) by mouth daily. Swallow whole. 90 tablet 3   clonazePAM (KLONOPIN) 0.25 MG disintegrating tablet Take 1 tablet (0.25 mg total) by mouth 2 (two) times daily. 60 tablet 1   fexofenadine (ALLEGRA) 180 MG tablet Take 180 mg by mouth every other day.     levocetirizine (XYZAL) 5 MG tablet Take 5 mg by mouth every other day.     mometasone (NASONEX) 50 MCG/ACT nasal spray Place 2 sprays into the nose daily as needed (allergies).     PATADAY 0.7 % SOLN SMARTSIG:1 Drop(s) In Eye(s) Daily PRN     sertraline (ZOLOFT) 100 MG tablet Take 1 tablet (100 mg total) by mouth daily. 90 tablet 3   No facility-administered medications prior to visit.    Allergies  Allergen Reactions   Sudafed [Pseudoephedrine] Anxiety and Palpitations    ROS Review of Systems  Constitutional:  Negative for appetite change, chills, fatigue and fever.  HENT:  Negative for congestion, dental problem, ear pain and sore throat.   Eyes:  Negative for discharge, redness and visual disturbance.  Respiratory:  Negative for cough, chest tightness, shortness of breath and wheezing.   Cardiovascular:  Negative for chest pain, palpitations and leg swelling.  Gastrointestinal:  Negative for abdominal pain, blood in stool, diarrhea, nausea and vomiting.  Genitourinary:  Negative for difficulty urinating, dysuria, flank pain, frequency, hematuria and urgency.  Musculoskeletal:  Negative for arthralgias, back pain, joint swelling, myalgias and neck stiffness.  Skin:  Negative for pallor and rash.  Neurological:   Negative for dizziness, speech difficulty, weakness and headaches.  Hematological:  Negative for adenopathy. Does not bruise/bleed easily.  Psychiatric/Behavioral:  Negative for confusion and sleep disturbance. The patient is not nervous/anxious.     PE;    03/31/2022    1:04 PM 01/19/2022    8:53 AM 11/23/2021    8:02 AM  Vitals with BMI  Height '6\' 0"'$  '6\' 0"'$  '6\' 0"'$   Weight 201 lbs 13 oz 199 lbs 10 oz 200 lbs  BMI 27.36 03.47 42.59  Systolic 563 875 643  Diastolic 79 74 64  Pulse 62 63 72     Lab Results  Component Value Date   TSH 0.97 11/23/2020   Lab Results  Component Value Date   WBC 5.2 04/14/2021   HGB 14.9 04/14/2021   HCT 43.0 04/14/2021   MCV 93.7 04/14/2021   PLT 192.0 04/14/2021   Lab Results  Component Value Date   CREATININE 0.78 03/29/2021   BUN 13 03/29/2021   NA 140 03/29/2021   K 4.1 03/29/2021   CL 103 03/29/2021   CO2 27 03/29/2021   Lab Results  Component Value Date   ALT 54 (H) 03/29/2021   AST 24 03/29/2021   ALKPHOS 68 03/29/2021   BILITOT 1.1 03/29/2021   Lab Results  Component Value Date   CHOL 156 04/14/2021   Lab Results  Component Value Date   HDL 45.00 04/14/2021   Lab Results  Component Value Date   LDLCALC 97 04/14/2021   Lab Results  Component Value Date   TRIG 71.0 04/14/2021   Lab Results  Component Value Date   CHOLHDL 3 04/14/2021   Pertinent labs:  Lab Results  Component Value Date   TSH 0.97 11/23/2020   Lab Results  Component Value Date   WBC 5.2 04/14/2021   HGB 14.9 04/14/2021   HCT 43.0 04/14/2021   MCV 93.7 04/14/2021   PLT 192.0 04/14/2021   Lab Results  Component Value Date   CREATININE 0.78 03/29/2021   BUN 13 03/29/2021   NA 140 03/29/2021   K 4.1 03/29/2021   CL 103 03/29/2021   CO2 27 03/29/2021   Lab Results  Component Value Date   ALT 54 (H) 03/29/2021   AST 24 03/29/2021   ALKPHOS 68 03/29/2021   BILITOT 1.1 03/29/2021   Lab Results  Component Value Date   CHOL 156  04/14/2021   Lab Results  Component Value Date   HDL 45.00 04/14/2021   Lab Results  Component Value Date   LDLCALC 97 04/14/2021   Lab Results  Component Value Date   TRIG 71.0 04/14/2021   Lab Results  Component Value Date   CHOLHDL 3 04/14/2021   ASSESSMENT AND PLAN:   No problem-specific Assessment & Plan notes found for this encounter.  Health maintenance exam: Reviewed age and gender appropriate health maintenance issues (prudent diet, regular exercise, health risks of tobacco and excessive alcohol, use of seatbelts, fire alarms in home, use of sunscreen).  Also reviewed age and gender appropriate health screening as well as vaccine recommendations. Vaccines: UTD Labs: Health panel today Prostate ca screening: average risk patient= as per latest guidelines, start screening at 44 yrs of age. Colon ca screening: iFOB neg 1 yr ago. -rpt iFOB ordered today.  An After Visit Summary was printed and given to the patient.  FOLLOW UP:  Return in about 6 months (around 09/30/2022) for routine chronic illness f/u.  Signed:  Crissie Sickles, MD           03/31/2022

## 2022-04-01 LAB — DRUG MONITORING, PANEL 8 WITH CONFIRMATION, URINE
6 Acetylmorphine: NEGATIVE ng/mL (ref <10)
Alcohol Metabolites: NEGATIVE ng/mL (ref <500)
Amphetamines: NEGATIVE ng/mL (ref <500)
Benzodiazepines: NEGATIVE ng/mL (ref <100)
Buprenorphine, Urine: NEGATIVE ng/mL (ref <5)
Cocaine Metabolite: NEGATIVE ng/mL (ref <150)
Creatinine: 34.2 mg/dL (ref >=20.0)
MDMA: NEGATIVE ng/mL (ref <500)
Marijuana Metabolite: NEGATIVE ng/mL (ref <20)
Opiates: NEGATIVE ng/mL (ref <100)
Oxidant: NEGATIVE ug/mL (ref <200)
Oxycodone: NEGATIVE ng/mL (ref <100)
pH: 5.9 (ref 4.5–9.0)

## 2022-04-01 LAB — DM TEMPLATE

## 2022-04-12 ENCOUNTER — Telehealth: Payer: Self-pay

## 2022-04-12 NOTE — Telephone Encounter (Signed)
-----   Message from Park Liter, MD sent at 04/12/2022  3:40 PM EDT ----- Echocardiogram showed low normal ejection fraction.  Waiting else looks good, continue present management

## 2022-04-12 NOTE — Telephone Encounter (Signed)
Patient notified of results.

## 2022-04-27 ENCOUNTER — Other Ambulatory Visit: Payer: Self-pay | Admitting: Internal Medicine

## 2022-08-18 ENCOUNTER — Encounter: Payer: Self-pay | Admitting: Family Medicine

## 2022-08-18 LAB — FECAL OCCULT BLOOD, IMMUNOCHEMICAL: Fecal Occult Bld: NEGATIVE

## 2022-09-29 ENCOUNTER — Ambulatory Visit (INDEPENDENT_AMBULATORY_CARE_PROVIDER_SITE_OTHER): Payer: Commercial Managed Care - PPO | Admitting: Family Medicine

## 2022-09-29 ENCOUNTER — Encounter: Payer: Self-pay | Admitting: Family Medicine

## 2022-09-29 VITALS — BP 119/73 | HR 76 | Ht 72.0 in | Wt 204.8 lb

## 2022-09-29 DIAGNOSIS — Z8659 Personal history of other mental and behavioral disorders: Secondary | ICD-10-CM

## 2022-09-29 DIAGNOSIS — F411 Generalized anxiety disorder: Secondary | ICD-10-CM

## 2022-09-29 MED ORDER — SERTRALINE HCL 100 MG PO TABS: 100.0000 mg | ORAL_TABLET | Freq: Every day | ORAL | 3 refills | Status: DC

## 2022-09-29 MED ORDER — CLONAZEPAM 0.25 MG PO TBDP: 0.2500 mg | ORAL_TABLET | Freq: Two times a day (BID) | ORAL | 1 refills | Status: DC

## 2022-09-29 NOTE — Progress Notes (Signed)
OFFICE VISIT  09/29/2022  CC: f/u anx/mood  Patient is a 51 y.o. male who presents for follow-up anxiety.  INTERIM HX: Doing very well long-term on sertraline 100 mg a day and clonazepam 0.25 mg twice daily as needed.  He primarily uses clonazepam to help him rest at night.  Occasional daytime use for acute anxiety. He says he has about 2 panic attacks a year and they are mild.   PMP AWARE reviewed today: most recent rx for clonazepam 0.25 mg was filled 01/03/2022, # 47, rx by me. No red flags.  Past Medical History:  Diagnosis Date   Anxiety    Asthma due to seasonal allergies    Colon cancer screening    07/2022 iFOB neg.   Dyspnea on exertion 02/09/2020   Hypertriglyceridemia 02/09/2020   Inguinal hernia 05/07/2014   L   Noncompaction cardiomyopathy ([facility]) 03/26/2020   OSA on CPAP    initial sx: palpitations-fatigue--s/p surgery, much improved   Palpitations 02/09/2020   Endo 11/2020-->pheo w/u NEG   Seasonal allergies     Past Surgical History:  Procedure Laterality Date   Cardiac MRI  04/12/2020   noncompaction of vent myocardium   CHOLECYSTECTOMY N/A 12/06/2016   Procedure: LAPAROSCOPIC CHOLECYSTECTOMY WITH INTRAOPERATIVE CHOLANGIOGRAM;  Surgeon: Autumn Messing III, MD;  Location: [facility];  Service: General;  Laterality: N/A;   MASS EXCISION N/A 08/31/2017   Procedure: MINOR EXCISION SEBACEOUS CYST ON BACK X 2;  Surgeon: Jovita Kussmaul, MD;  Location: [facility];  Service: General;  Laterality: N/A;   THROAT SURGERY  1982   tonsilectomy, uvulectomy d/t OSA    TRANSTHORACIC ECHOCARDIOGRAM  02/28/2021   EF 50-55%, mild LA dilation, o/w normal. 03/28/22 EF 50-55%, mod LA dilat, o/w normal.   zio patch  08/2020   some pvc's o/w normal    Outpatient Medications Prior to Visit  Medication Sig Dispense Refill   aspirin EC 81 MG tablet Take 1 tablet (81 mg total) by mouth daily. Swallow whole. 90 tablet 3   clonazePAM (KLONOPIN) 0.25 MG disintegrating tablet Take  1 tablet (0.25 mg total) by mouth 2 (two) times daily. 60 tablet 1   fexofenadine (ALLEGRA) 180 MG tablet Take 180 mg by mouth every other day.     levocetirizine (XYZAL) 5 MG tablet Take 5 mg by mouth every other day.     sertraline (ZOLOFT) 100 MG tablet TAKE 1 TABLET BY MOUTH EVERY DAY 90 tablet 3   albuterol (VENTOLIN HFA) 108 (90 Base) MCG/ACT inhaler Inhale 2 puffs into the lungs every 6 (six) hours as needed for wheezing or shortness of breath. (Patient not taking: Reported on 09/29/2022) 1 Inhaler 1   mometasone (NASONEX) 50 MCG/ACT nasal spray Place 2 sprays into the nose daily as needed (allergies). (Patient not taking: Reported on 09/29/2022)     PATADAY 0.7 % SOLN SMARTSIG:1 Drop(s) In Eye(s) Daily PRN (Patient not taking: Reported on 09/29/2022)     No facility-administered medications prior to visit.    Allergies  Allergen Reactions   Sudafed [Pseudoephedrine] Anxiety and Palpitations    ROS As per HPI  PE:    09/29/2022    1:35 PM 03/31/2022    1:04 PM 01/19/2022    8:53 AM  Vitals with BMI  Height '6\' 0"'$  '6\' 0"'$  '6\' 0"'$   Weight 204 lbs 13 oz 201 lbs 13 oz 199 lbs 10 oz  BMI 27.77 17.61 60.73  Systolic 710 626 948  Diastolic 73 79 74  Pulse 76 62 63     Physical Exam  Wt Readings from Last 2 Encounters:  09/29/22 204 lb 12.8 oz (92.9 kg)  03/31/22 201 lb 12.8 oz (91.5 kg)    Gen: alert, oriented x 4, affect pleasant.  Lucid thinking and conversation noted. HEENT: PERRLA, EOMI.   Neck: no LAD, mass, or thyromegaly. CV: RRR, no m/r/g LUNGS: CTA bilat, nonlabored. NEURO: no tremor or tics noted on observation.  Coordination intact. CN 2-12 grossly intact bilaterally, strength 5/5 in all extremeties.  No ataxia.   LABS:  Last CBC Lab Results  Component Value Date   WBC 6.9 03/31/2022   HGB 14.6 03/31/2022   HCT 42.3 03/31/2022   MCV 92.8 03/31/2022   MCH 32.4 12/03/2016   RDW 12.3 03/31/2022   PLT 246.0 61/44/3154   Last metabolic panel Lab Results   Component Value Date   GLUCOSE 87 03/31/2022   NA 136 03/31/2022   K 4.2 03/31/2022   CL 99 03/31/2022   CO2 29 03/31/2022   BUN 11 03/31/2022   CREATININE 0.70 03/31/2022   GFRNONAA >60 12/03/2016   CALCIUM 9.6 03/31/2022   PROT 7.0 03/31/2022   ALBUMIN 4.5 03/31/2022   BILITOT 1.1 03/31/2022   ALKPHOS 77 03/31/2022   AST 25 03/31/2022   ALT 49 03/31/2022   ANIONGAP 6 12/03/2016   Last lipids Lab Results  Component Value Date   CHOL 199 03/31/2022   HDL 43.40 03/31/2022   LDLCALC 119 (H) 03/31/2022   LDLDIRECT 98.0 03/05/2018   TRIG 184.0 (H) 03/31/2022   CHOLHDL 5 03/31/2022   Last hemoglobin A1c Lab Results  Component Value Date   HGBA1C 5.5 03/29/2021   Last thyroid functions Lab Results  Component Value Date   TSH 0.54 03/31/2022   IMPRESSION AND PLAN:  #1 generalized anxiety disorder with history of panic attacks. Has had long-term stability on sertraline 100 mg a day.  He does use clonazepam 0.25 mg twice daily as needed. Refilled medications today Controlled substance contract and urine drug screen are up-to-date.  An After Visit Summary was printed and given to the patient.  FOLLOW UP: Return in about 6 months (around 03/31/2023) for annual CPE (fasting.  Signed:  Crissie Sickles, MD           09/29/2022

## 2023-01-24 ENCOUNTER — Ambulatory Visit: Payer: Commercial Managed Care - PPO | Admitting: Adult Health

## 2023-02-07 ENCOUNTER — Ambulatory Visit: Payer: Commercial Managed Care - PPO | Admitting: Adult Health

## 2023-03-29 NOTE — Patient Instructions (Signed)

## 2023-04-02 ENCOUNTER — Encounter: Payer: Self-pay | Admitting: Family Medicine

## 2023-04-02 ENCOUNTER — Ambulatory Visit (INDEPENDENT_AMBULATORY_CARE_PROVIDER_SITE_OTHER): Payer: Commercial Managed Care - PPO | Admitting: Family Medicine

## 2023-04-02 VITALS — BP 126/76 | HR 68 | Ht 73.0 in | Wt 201.8 lb

## 2023-04-02 DIAGNOSIS — Z125 Encounter for screening for malignant neoplasm of prostate: Secondary | ICD-10-CM

## 2023-04-02 DIAGNOSIS — F411 Generalized anxiety disorder: Secondary | ICD-10-CM

## 2023-04-02 DIAGNOSIS — G479 Sleep disorder, unspecified: Secondary | ICD-10-CM

## 2023-04-02 DIAGNOSIS — Z0000 Encounter for general adult medical examination without abnormal findings: Secondary | ICD-10-CM

## 2023-04-02 DIAGNOSIS — F5104 Psychophysiologic insomnia: Secondary | ICD-10-CM

## 2023-04-02 HISTORY — DX: Sleep disorder, unspecified: G47.9

## 2023-04-02 MED ORDER — CLONAZEPAM 0.25 MG PO TBDP: 0.2500 mg | ORAL_TABLET | Freq: Two times a day (BID) | ORAL | 1 refills | Status: DC

## 2023-04-02 NOTE — Progress Notes (Signed)
Office Note 04/02/2023  CC:  Chief Complaint  Patient presents with   Annual Exam    None Fasting CPE. Pt had lunch about an hour ago   HPI:  Patient is a 52 y.o. male who is here for annual health maintenance exam and follow-up anxiety. David Lowery feels very well.  He is exercising regularly and has developed a great dietary habits.  Uses clonazepam almost exclusively at night when his mind will not shut down from anxiety.  He also uses it sometimes at night because it helps make his CPAP mask tolerable. He uses the clonazepam 2 days/week or less.  PMP AWARE reviewed today: most recent rx for clonazepam was filled 09/29/2022, # 60, rx by me. No red flags.  Past Medical History:  Diagnosis Date   Anxiety    Asthma due to seasonal allergies    Basal cell carcinoma (BCC) of scalp    also perinasal   Colon cancer screening    07/2022 iFOB neg.   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   OSA on CPAP    initial sx: palpitations-fatigue--s/p surgery, much improved   Palpitations 02/09/2020   Endo 11/2020-->pheo w/u NEG   Seasonal allergies     Past Surgical History:  Procedure Laterality Date   Cardiac MRI  04/12/2020   noncompaction of vent myocardium   CHOLECYSTECTOMY N/A 12/06/2016   Procedure: LAPAROSCOPIC CHOLECYSTECTOMY WITH INTRAOPERATIVE CHOLANGIOGRAM;  Surgeon: Chevis Pretty III, MD;  Location: [facility];  Service: General;  Laterality: N/A;   MASS EXCISION N/A 08/31/2017   Procedure: MINOR EXCISION SEBACEOUS CYST ON BACK X 2;  Surgeon: Griselda Miner, MD;  Location: [facility];  Service: General;  Laterality: N/A;   THROAT SURGERY  1982   tonsilectomy, uvulectomy d/t OSA    TRANSTHORACIC ECHOCARDIOGRAM  02/28/2021   EF 50-55%, mild LA dilation, o/w normal.  03/28/22 EF 50-55%, mod LA dilat, o/w normal.   zio patch  08/2020   some pvc's o/w normal     Family History  Problem Relation Age of Onset   High Cholesterol Mother    Lung cancer Father    Drug abuse Father    Non-Hodgkin's lymphoma Sister    Hyperlipidemia Brother        bro, mother, GM   Colon cancer Maternal Grandmother    Alcohol abuse Paternal Grandfather    Colon cancer Other        GM at age 74   Diabetes Other        aunt   CAD Neg Hx    Stroke Neg Hx    Prostate cancer Neg Hx    Sleep apnea Neg Hx     Social History   Socioeconomic History   Marital status: Married    Spouse name: Not on file   Number of children: 1   Years of education: Not on file   Highest education level: Associate degree: occupational, Scientist, product/process development, or vocational program  Occupational History   Occupation: Nutritional therapist: Tax inspector Services    Comment: [employer]  Tobacco Use   Smoking status: Never    Passive exposure: Never   Smokeless tobacco: Never  Vaping Use   Vaping Use: Never used  Substance and Sexual Activity   Alcohol use: Yes    Comment: Less than 2 drinks per month    Drug use: No  Sexual activity: Not on file  Other Topics Concern   Not on file  Social History Narrative   Married, Son born ~ 2012.   Moved to [facility] July 2014, from [place]   Some college/trade school.     Occupation: Database administrator for [employer]   No tobacco or drugs.      Social Determinants of Health   Financial Resource Strain: Low Risk  (09/25/2022)   Overall Financial Resource Strain (CARDIA)    Difficulty of Paying Living Expenses: Not hard at all  Food Insecurity: No Food Insecurity (09/25/2022)   Hunger Vital Sign    Worried About Running Out of Food in the Last Year: Never true    Ran Out of Food in the Last Year: Never true  Transportation Needs: No Transportation Needs (09/25/2022)   PRAPARE - Administrator, Civil Service (Medical): No    Lack of Transportation (Non-Medical): No  Physical Activity: Insufficiently Active (09/25/2022)   Exercise  Vital Sign    Days of Exercise per Week: 3 days    Minutes of Exercise per Session: 30 min  Stress: No Stress Concern Present (09/25/2022)   [employer] of Occupational Health - Occupational Stress Questionnaire    Feeling of Stress : Only a little  Social Connections: Unknown (09/25/2022)   Social Connection and Isolation Panel [NHANES]    Frequency of Communication with Friends and Family: More than three times a week    Frequency of Social Gatherings with Friends and Family: Patient declined    Attends Religious Services: Patient declined    Database administrator or Organizations: No    Attends Engineer, structural: Not on file    Marital Status: Married  Catering manager Violence: Not on file    Outpatient Medications Prior to Visit  Medication Sig Dispense Refill   aspirin EC 81 MG tablet Take 1 tablet (81 mg total) by mouth daily. Swallow whole. 90 tablet 3   fexofenadine (ALLEGRA) 180 MG tablet Take 180 mg by mouth every other day.     levocetirizine (XYZAL) 5 MG tablet Take 5 mg by mouth every other day.     sertraline (ZOLOFT) 100 MG tablet Take 1 tablet (100 mg total) by mouth daily. 90 tablet 3   albuterol (VENTOLIN HFA) 108 (90 Base) MCG/ACT inhaler Inhale 2 puffs into the lungs every 6 (six) hours as needed for wheezing or shortness of breath. (Patient not taking: Reported on 09/29/2022) 1 Inhaler 1   mometasone (NASONEX) 50 MCG/ACT nasal spray Place 2 sprays into the nose daily as needed (allergies). (Patient not taking: Reported on 09/29/2022)     clonazePAM (KLONOPIN) 0.25 MG disintegrating tablet Take 1 tablet (0.25 mg total) by mouth 2 (two) times daily. 60 tablet 1   No facility-administered medications prior to visit.    Allergies  Allergen Reactions   Sudafed [Pseudoephedrine] Anxiety and Palpitations    Review of Systems  Constitutional:  Negative for appetite change, chills, fatigue and fever.  HENT:  Negative for congestion, dental problem, ear  pain and sore throat.   Eyes:  Negative for discharge, redness and visual disturbance.  Respiratory:  Negative for cough, chest tightness, shortness of breath and wheezing.   Cardiovascular:  Negative for chest pain, palpitations and leg swelling.  Gastrointestinal:  Negative for abdominal pain, blood in stool, diarrhea, nausea and vomiting.  Genitourinary:  Negative for difficulty urinating, dysuria, flank pain, frequency, hematuria and urgency.  Musculoskeletal:  Negative for arthralgias,  back pain, joint swelling, myalgias and neck stiffness.  Skin:  Negative for pallor and rash.  Neurological:  Negative for dizziness, speech difficulty, weakness and headaches.  Hematological:  Negative for adenopathy. Does not bruise/bleed easily.  Psychiatric/Behavioral:  Negative for confusion and sleep disturbance. The patient is not nervous/anxious.     PE;    04/02/2023    1:32 PM 09/29/2022    1:35 PM 03/31/2022    1:04 PM  Vitals with BMI  Height 6\' 1"  6\' 0"  6\' 0"   Weight 201 lbs 13 oz 204 lbs 13 oz 201 lbs 13 oz  BMI 26.63 27.77 27.36  Systolic 126 119 161  Diastolic 76 73 79  Pulse 68 76 62    Gen: Alert, well appearing.  Patient is oriented to person, place, time, and situation. AFFECT: pleasant, lucid thought and speech. ENT: Ears: EACs clear, normal epithelium.  TMs with good light reflex and landmarks bilaterally.  Eyes: no injection, icteris, swelling, or exudate.  EOMI, PERRLA. Nose: no drainage or turbinate edema/swelling.  No injection or focal lesion.  Mouth: lips without lesion/swelling.  Oral mucosa pink and moist.  Dentition intact and without obvious caries or gingival swelling.  Oropharynx without erythema, exudate, or swelling.  Neck: supple/nontender.  No LAD, mass, or TM.  Carotid pulses 2+ bilaterally, without bruits. CV: RRR, no m/r/g.   LUNGS: CTA bilat, nonlabored resps, good aeration in all lung fields. ABD: soft, NT, ND, BS normal.  No hepatospenomegaly or mass.  No  bruits. EXT: no clubbing, cyanosis, or edema.  Musculoskeletal: no joint swelling, erythema, warmth, or tenderness.  ROM of all joints intact. Skin - no sores or suspicious lesions or rashes or color changes  Pertinent labs:  Lab Results  Component Value Date   TSH 0.54 03/31/2022   Lab Results  Component Value Date   WBC 6.9 03/31/2022   HGB 14.6 03/31/2022   HCT 42.3 03/31/2022   MCV 92.8 03/31/2022   PLT 246.0 03/31/2022   Lab Results  Component Value Date   CREATININE 0.70 03/31/2022   BUN 11 03/31/2022   NA 136 03/31/2022   K 4.2 03/31/2022   CL 99 03/31/2022   CO2 29 03/31/2022   Lab Results  Component Value Date   ALT 49 03/31/2022   AST 25 03/31/2022   ALKPHOS 77 03/31/2022   BILITOT 1.1 03/31/2022   Lab Results  Component Value Date   CHOL 199 03/31/2022   Lab Results  Component Value Date   HDL 43.40 03/31/2022   Lab Results  Component Value Date   LDLCALC 119 (H) 03/31/2022   Lab Results  Component Value Date   TRIG 184.0 (H) 03/31/2022   Lab Results  Component Value Date   CHOLHDL 5 03/31/2022   Lab Results  Component Value Date   HGBA1C 5.5 03/29/2021   ASSESSMENT AND PLAN:   #1 health maintenance exam: Reviewed age and gender appropriate health maintenance issues (prudent diet, regular exercise, health risks of tobacco and excessive alcohol, use of seatbelts, fire alarms in home, use of sunscreen).  Also reviewed age and gender appropriate health screening as well as vaccine recommendations. Vaccines: Shingrix--> he deferred for now. Labs: Health panel--->future when fasting. Prostate ca screening: start annual PSAs now. Colon ca screening: iFOB neg 07/2022.  Repeat 07/2023.  #2 anxiety, most prominently manifesting as insomnia. Continue clonazepam 0.25 mg, 1 twice daily as needed, #60, refill x 1.  An After Visit Summary was printed and given to the  patient.  FOLLOW UP:  Return in about 6 months (around 10/02/2023) for routine  chronic illness f/u.  Signed:  Santiago Bumpers, MD           04/02/2023

## 2023-04-02 NOTE — Addendum Note (Signed)
Addended by: Leonie Douglas on: 04/02/2023 02:15 PM   Modules accepted: Orders

## 2023-04-03 LAB — DRUG MONITORING PANEL 376104, URINE
Amphetamines: NEGATIVE ng/mL (ref <500)
Barbiturates: NEGATIVE ng/mL (ref <300)
Benzodiazepines: NEGATIVE ng/mL (ref <100)
Cocaine Metabolite: NEGATIVE ng/mL (ref <150)
Desmethyltramadol: NEGATIVE ng/mL (ref <100)
Opiates: NEGATIVE ng/mL (ref <100)
Oxycodone: NEGATIVE ng/mL (ref <100)
Tramadol: NEGATIVE ng/mL (ref <100)

## 2023-04-03 LAB — DM TEMPLATE

## 2023-04-04 ENCOUNTER — Other Ambulatory Visit (INDEPENDENT_AMBULATORY_CARE_PROVIDER_SITE_OTHER): Payer: Commercial Managed Care - PPO

## 2023-04-04 DIAGNOSIS — Z125 Encounter for screening for malignant neoplasm of prostate: Secondary | ICD-10-CM | POA: Diagnosis not present

## 2023-04-04 DIAGNOSIS — Z0000 Encounter for general adult medical examination without abnormal findings: Secondary | ICD-10-CM | POA: Diagnosis not present

## 2023-04-04 LAB — TSH: TSH: 1.05 u[IU]/mL (ref 0.35–5.50)

## 2023-04-04 LAB — COMPREHENSIVE METABOLIC PANEL
ALT: 29 U/L (ref 0–53)
AST: 23 U/L (ref 0–37)
Albumin: 4.6 g/dL (ref 3.5–5.2)
Alkaline Phosphatase: 62 U/L (ref 39–117)
BUN: 10 mg/dL (ref 6–23)
CO2: 26 mEq/L (ref 19–32)
Calcium: 9.3 mg/dL (ref 8.4–10.5)
Chloride: 102 mEq/L (ref 96–112)
Creatinine, Ser: 0.77 mg/dL (ref 0.40–1.50)
GFR: 104.73 mL/min (ref >60.00)
Glucose, Bld: 87 mg/dL (ref 70–99)
Potassium: 4 mEq/L (ref 3.5–5.1)
Sodium: 137 mEq/L (ref 135–145)
Total Bilirubin: 1.3 mg/dL — ABNORMAL HIGH (ref 0.2–1.2)
Total Protein: 7.1 g/dL (ref 6.0–8.3)

## 2023-04-04 LAB — CBC WITH DIFFERENTIAL/PLATELET
Basophils Absolute: 0.1 10*3/uL (ref 0.0–0.1)
Basophils Relative: 1 % (ref 0.0–3.0)
Eosinophils Absolute: 0.2 10*3/uL (ref 0.0–0.7)
Eosinophils Relative: 3.8 % (ref 0.0–5.0)
HCT: 42 % (ref 39.0–52.0)
Hemoglobin: 14.4 g/dL (ref 13.0–17.0)
Lymphocytes Relative: 38.6 % (ref 12.0–46.0)
Lymphs Abs: 2 10*3/uL (ref 0.7–4.0)
MCHC: 34.4 g/dL (ref 30.0–36.0)
MCV: 93 fl (ref 78.0–100.0)
Monocytes Absolute: 0.3 10*3/uL (ref 0.1–1.0)
Monocytes Relative: 5 % (ref 3.0–12.0)
Neutro Abs: 2.7 10*3/uL (ref 1.4–7.7)
Neutrophils Relative %: 51.6 % (ref 43.0–77.0)
Platelets: 254 10*3/uL (ref 150.0–400.0)
RBC: 4.52 Mil/uL (ref 4.22–5.81)
RDW: 12.4 % (ref 11.5–15.5)
WBC: 5.2 10*3/uL (ref 4.0–10.5)

## 2023-04-04 LAB — LIPID PANEL
Cholesterol: 164 mg/dL (ref 0–200)
HDL: 38.2 mg/dL — ABNORMAL LOW (ref >39.00)
LDL Cholesterol: 104 mg/dL — ABNORMAL HIGH (ref 0–99)
NonHDL: 125.73
Total CHOL/HDL Ratio: 4
Triglycerides: 107 mg/dL (ref 0.0–149.0)
VLDL: 21.4 mg/dL (ref 0.0–40.0)

## 2023-04-04 LAB — PSA: PSA: 0.45 ng/mL (ref 0.10–4.00)

## 2023-04-04 NOTE — Progress Notes (Signed)
Pt came for lab visit, tolerated draw well.

## 2023-10-03 ENCOUNTER — Encounter: Payer: Self-pay | Admitting: Family Medicine

## 2023-10-03 ENCOUNTER — Ambulatory Visit: Payer: Commercial Managed Care - PPO | Admitting: Family Medicine

## 2023-10-03 VITALS — BP 137/81 | HR 61 | Wt 207.0 lb

## 2023-10-03 DIAGNOSIS — Z1211 Encounter for screening for malignant neoplasm of colon: Secondary | ICD-10-CM

## 2023-10-03 DIAGNOSIS — F411 Generalized anxiety disorder: Secondary | ICD-10-CM | POA: Diagnosis not present

## 2023-10-03 MED ORDER — SERTRALINE HCL 100 MG PO TABS: 100.0000 mg | ORAL_TABLET | Freq: Every day | ORAL | 3 refills | Status: DC

## 2023-10-03 NOTE — Progress Notes (Signed)
OFFICE VISIT  10/03/2023  CC:  Chief Complaint  Patient presents with   Anxiety    Patient is a 52 y.o. male who presents for 39-month follow-up anxiety.  INTERIM HX: David Lowery is doing very well.  Mood and anxiety levels are very stable. 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 Enjoying this a lot. Rarely uses clonazepam.  Does not need refill today.   PMP AWARE reviewed today: most recent rx for clonazepam was filled 04/02/23, # 60, rx by me. No red flags.  Past Medical History:  Diagnosis Date   Anxiety    Asthma due to seasonal allergies    Basal cell carcinoma (BCC) of scalp    also perinasal   Colon cancer screening    07/2022 iFOB neg.   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   OSA on CPAP    initial sx: palpitations-fatigue--s/p surgery, much improved   Palpitations 02/09/2020   Endo 11/2020-->pheo w/u NEG   Seasonal allergies     Past Surgical History:  Procedure Laterality Date   Cardiac MRI  04/12/2020   noncompaction of vent myocardium   CHOLECYSTECTOMY N/A 12/06/2016   Procedure: LAPAROSCOPIC CHOLECYSTECTOMY WITH INTRAOPERATIVE CHOLANGIOGRAM;  Surgeon: Chevis Pretty III, MD;  Location: [facility];  Service: General;  Laterality: N/A;   MASS EXCISION N/A 08/31/2017   Procedure: MINOR EXCISION SEBACEOUS CYST ON BACK X 2;  Surgeon: Griselda Miner, MD;  Location: [facility];  Service: General;  Laterality: N/A;   THROAT SURGERY  1982   tonsilectomy, uvulectomy d/t OSA    TRANSTHORACIC ECHOCARDIOGRAM  02/28/2021   EF 50-55%, mild LA dilation, o/w normal.  03/28/22 EF 50-55%, mod LA dilat, o/w normal.   zio patch  08/2020   some pvc's o/w normal    Outpatient Medications Prior to Visit  Medication Sig Dispense Refill   aspirin EC 81 MG tablet Take 1 tablet (81 mg total) by mouth daily. Swallow whole. 90 tablet 3   clonazePAM (KLONOPIN)  0.25 MG disintegrating tablet Take 1 tablet (0.25 mg total) by mouth 2 (two) times daily. 60 tablet 1   fexofenadine (ALLEGRA) 180 MG tablet Take 180 mg by mouth every other day.     levocetirizine (XYZAL) 5 MG tablet Take 5 mg by mouth every other day.     sertraline (ZOLOFT) 100 MG tablet Take 1 tablet (100 mg total) by mouth daily. 90 tablet 3   albuterol (VENTOLIN HFA) 108 (90 Base) MCG/ACT inhaler Inhale 2 puffs into the lungs every 6 (six) hours as needed for wheezing or shortness of breath. (Patient not taking: Reported on 09/29/2022) 1 Inhaler 1   mometasone (NASONEX) 50 MCG/ACT nasal spray Place 2 sprays into the nose daily as needed (allergies). (Patient not taking: Reported on 09/29/2022)     No facility-administered medications prior to visit.    Allergies  Allergen Reactions   Sudafed [Pseudoephedrine] Anxiety and Palpitations    Review of Systems As per HPI  PE:    10/03/2023    1:01 PM 04/02/2023    1:32 PM 09/29/2022    1:35 PM  Vitals with BMI  Height  6\' 1"  6\' 0"   Weight 207 lbs 201 lbs 13 oz 204 lbs 13 oz  BMI  26.63 27.77  Systolic 137 126 161  Diastolic 81 76 73  Pulse 61 68 76   Manual blood pressure repeat 126/74  Physical Exam  Gen: Alert, well appearing.  Patient is oriented to person, place, time, and situation. AFFECT: pleasant, lucid thought and speech. No further exam today.  LABS:  Last CBC Lab Results  Component Value Date   WBC 5.2 04/04/2023   HGB 14.4 04/04/2023   HCT 42.0 04/04/2023   MCV 93.0 04/04/2023   MCH 32.4 12/03/2016   RDW 12.4 04/04/2023   PLT 254.0 04/04/2023   Last metabolic panel Lab Results  Component Value Date   GLUCOSE 87 04/04/2023   NA 137 04/04/2023   K 4.0 04/04/2023   CL 102 04/04/2023   CO2 26 04/04/2023   BUN 10 04/04/2023   CREATININE 0.77 04/04/2023   GFR 104.73 04/04/2023   CALCIUM 9.3 04/04/2023   PROT 7.1 04/04/2023   ALBUMIN 4.6 04/04/2023   BILITOT 1.3 (H) 04/04/2023   ALKPHOS 62  04/04/2023   AST 23 04/04/2023   ALT 29 04/04/2023   ANIONGAP 6 12/03/2016   Last lipids Lab Results  Component Value Date   CHOL 164 04/04/2023   HDL 38.20 (L) 04/04/2023   LDLCALC 104 (H) 04/04/2023   LDLDIRECT 98.0 03/05/2018   TRIG 107.0 04/04/2023   CHOLHDL 4 04/04/2023   Last hemoglobin A1c Lab Results  Component Value Date   HGBA1C 5.5 03/29/2021   Last thyroid functions Lab Results  Component Value Date   TSH 1.05 04/04/2023   IMPRESSION AND PLAN:  #1 GAD, doing well long-term on sertraline 100 mg a day and clonazepam 0.25 mg twice daily as needed.  He takes the clonazepam infrequently.  He does not need a new prescription for this today.  2 colon cancer screening. iFOB neg 07/2022. Rpt iFOB ordered today.  An After Visit Summary was printed and given to the patient.  FOLLOW UP: Return in about 6 months (around 04/02/2024) for annual CPE (fasting). Next CPE 03/2024 Signed:  Santiago Bumpers, MD           10/03/2023

## 2024-02-20 ENCOUNTER — Ambulatory Visit (INDEPENDENT_AMBULATORY_CARE_PROVIDER_SITE_OTHER): Admitting: Family Medicine

## 2024-02-20 ENCOUNTER — Encounter: Payer: Self-pay | Admitting: Family Medicine

## 2024-02-20 VITALS — BP 97/59 | HR 61 | Temp 98.2°F | Ht 73.0 in | Wt 191.8 lb

## 2024-02-20 DIAGNOSIS — K4090 Unilateral inguinal hernia, without obstruction or gangrene, not specified as recurrent: Secondary | ICD-10-CM

## 2024-02-20 DIAGNOSIS — M24851 Other specific joint derangements of right hip, not elsewhere classified: Secondary | ICD-10-CM

## 2024-02-20 NOTE — Progress Notes (Signed)
 OFFICE VISIT  02/20/2024  CC:  Chief Complaint  Patient presents with   Hernia Concern   Hip Concern    5-6 days ago, denies pain or discomfort, has not taken anything.    Patient is a 53 y.o. male who presents for "inguinal hernia and hip popping when walking".  HPI: David Lowery has had an inguinal hernia on the left for the last several years. It initially stayed about the same size for a long time and did not bother him.  Lately he feels like it has grown in size and he feels the effects of it every day, particularly with straining or coughing.  It does not hurt, per se.  He is eating and drinking normal, has no abdominal pain, and is passing gas and stool fine.  He works out by running 2 to 3 miles a day lately.  After he has been running about 20 to 25 minutes he starts to hear and feel a snap at the lateral aspect of the right hip.  It does not hurt.  If he runs slower or walks it goes away.  He is not have any snapping or pain in the groin region. The range of motion of his hip is normal.   Past Medical History:  Diagnosis Date   Anxiety    Asthma due to seasonal allergies    Basal cell carcinoma (BCC) of scalp    also perinasal   Colon cancer screening    07/2022 iFOB neg.   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   OSA on CPAP    initial sx: palpitations-fatigue--s/p surgery, much improved   Palpitations 02/09/2020   Endo 11/2020-->pheo w/u NEG   Seasonal allergies     Past Surgical History:  Procedure Laterality Date   Cardiac MRI  04/12/2020   noncompaction of vent myocardium   CHOLECYSTECTOMY N/A 12/06/2016   Procedure: LAPAROSCOPIC CHOLECYSTECTOMY WITH INTRAOPERATIVE CHOLANGIOGRAM;  Surgeon: Lillette Reid III, MD;  Location: [facility];  Service: General;  Laterality: N/A;   MASS EXCISION N/A 08/31/2017   Procedure: MINOR EXCISION SEBACEOUS CYST ON BACK X 2;  Surgeon:  Caralyn Chandler, MD;  Location: [facility];  Service: General;  Laterality: N/A;   THROAT SURGERY  1982   tonsilectomy, uvulectomy d/t OSA    TRANSTHORACIC ECHOCARDIOGRAM  02/28/2021   EF 50-55%, mild LA dilation, o/w normal.  03/28/22 EF 50-55%, mod LA dilat, o/w normal.   zio patch  08/2020   some pvc's o/w normal    Outpatient Medications Prior to Visit  Medication Sig Dispense Refill   aspirin  EC 81 MG tablet Take 1 tablet (81 mg total) by mouth daily. Swallow whole. 90 tablet 3   clonazePAM  (KLONOPIN ) 0.25 MG disintegrating tablet Take 1 tablet (0.25 mg total) by mouth 2 (two) times daily. 60 tablet 1   fexofenadine (ALLEGRA) 180 MG tablet Take 180 mg by mouth every other day.     levocetirizine (XYZAL) 5 MG tablet Take 5 mg by mouth every other day.     sertraline  (ZOLOFT ) 100 MG tablet Take 1 tablet (100 mg total) by mouth daily. 90 tablet 3   albuterol  (VENTOLIN  HFA) 108 (90 Base) MCG/ACT inhaler Inhale 2 puffs into the lungs every 6 (six) hours as needed for wheezing or shortness of breath. (Patient not taking: Reported on 02/20/2024) 1 Inhaler 1   mometasone (NASONEX) 50 MCG/ACT nasal spray  Place 2 sprays into the nose daily as needed (allergies). (Patient not taking: Reported on 02/20/2024)     No facility-administered medications prior to visit.    Allergies  Allergen Reactions   Sudafed [Pseudoephedrine] Anxiety and Palpitations    Review of Systems  As per HPI  PE:    02/20/2024    9:51 AM 10/03/2023    1:01 PM 04/02/2023    1:32 PM  Vitals with BMI  Height 6\' 1"   6\' 1"   Weight 191 lbs 13 oz 207 lbs 201 lbs 13 oz  BMI 25.31  26.63  Systolic 97 137 126  Diastolic 59 81 76  Pulse 61 61 68     Physical Exam  Dental: Alert and well-appearing GU exam: He has a soft tissue bulge around the inguinal ligament area on the left.  It is soft and nontender.  This does reduce fully.  When he lies supine the area reduces spontaneously. Right hip range of  motion fully intact without pain.  No tenderness to palpation over the lateral hip.  No snapping can be induced today with range of motion exercises. Bedside MSK ultrasound shows normal gliding of the IT band over the greater trochanter.  The IT band does appear slightly thicker than normal.  LABS:  None  IMPRESSION AND PLAN:  #1 left inguinal hernia. This is enlarging and bothering him more lately.  He is interested in seeing a general surgeon to discuss surgical option.  2.  Lateral snapping hip syndrome, right side. Essentially asymptomatic. Reassured.  Gave him some IT band/snapping hip home exercises. He will return if starting to develop pain.  An After Visit Summary was printed and given to the patient.  FOLLOW UP: Return if symptoms worsen or fail to improve. Next CPE 03/2024 Signed:  Arletha Lady, MD           02/20/2024

## 2024-02-28 ENCOUNTER — Ambulatory Visit: Admitting: Family Medicine

## 2024-04-01 ENCOUNTER — Ambulatory Visit: Attending: Cardiology | Admitting: Cardiology

## 2024-04-01 ENCOUNTER — Encounter: Payer: Self-pay | Admitting: Cardiology

## 2024-04-01 VITALS — BP 108/60 | HR 60 | Ht 72.0 in | Wt 185.0 lb

## 2024-04-01 DIAGNOSIS — G4733 Obstructive sleep apnea (adult) (pediatric): Secondary | ICD-10-CM | POA: Diagnosis not present

## 2024-04-01 DIAGNOSIS — I428 Other cardiomyopathies: Secondary | ICD-10-CM

## 2024-04-01 DIAGNOSIS — R002 Palpitations: Secondary | ICD-10-CM | POA: Diagnosis not present

## 2024-04-01 DIAGNOSIS — R0600 Dyspnea, unspecified: Secondary | ICD-10-CM

## 2024-04-01 DIAGNOSIS — E781 Pure hyperglyceridemia: Secondary | ICD-10-CM | POA: Diagnosis not present

## 2024-04-01 DIAGNOSIS — R0609 Other forms of dyspnea: Secondary | ICD-10-CM

## 2024-04-01 NOTE — Patient Instructions (Addendum)
 Medication Instructions:  Your physician recommends that you continue on your current medications as directed. Please refer to the Current Medication list given to you today.  *If you need a refill on your cardiac medications before your next appointment, please call your pharmacy*   Lab Work: 3rd Floor   [address]  Your physician recommends that you return for lab work in:  when fasting  You need to have labs done when you are fasting.  You can come Monday through Friday 8:00 am to 11:30AM and 1:00 to 4:00. You do not need to make an appointment as the order has already been placed.    Testing/Procedures: Your physician has requested that you have an echocardiogram. Echocardiography is a painless test that uses sound waves to create images of your heart. It provides your doctor with information about the size and shape of your heart and how well your heart's chambers and valves are working. This procedure takes approximately one hour. There are no restrictions for this procedure. Please do NOT wear cologne, perfume, aftershave, or lotions (deodorant is allowed). Please arrive 15 minutes prior to your appointment time.  Please note: We ask at that you not bring children with you during ultrasound (echo/ vascular) testing. Due to room size and safety concerns, children are not allowed in the ultrasound rooms during exams. Our front office staff cannot provide observation of children in our lobby area while testing is being conducted. An adult accompanying a patient to their appointment will only be allowed in the ultrasound room at the discretion of the ultrasound technician under special circumstances. We apologize for any inconvenience.    Follow-Up: At [facility], you and your health needs are our priority.  As part of our continuing mission to provide you with exceptional heart care, we have created designated Provider Care Teams.  These Care Teams include your primary Cardiologist  (physician) and Advanced Practice Providers (APPs -  Physician Assistants and Nurse Practitioners) who all work together to provide you with the care you need, when you need it.  We recommend signing up for the patient portal called "MyChart".  Sign up information is provided on this After Visit Summary.  MyChart is used to connect with patients for Virtual Visits (Telemedicine).  Patients are able to view lab/test results, encounter notes, upcoming appointments, etc.  Non-urgent messages can be sent to your provider as well.   To learn more about what you can do with MyChart, go to [email or web address].    Your next appointment:   12 month(s)  The format for your next appointment:   In Person  Provider:   Gypsy Balsam, MD    Other Instructions NA

## 2024-04-01 NOTE — Progress Notes (Unsigned)
 Cardiology Office Note:    Date:  04/01/2024   ID:  CURREN MOHRMANN, DOB 04-22-1971, MRN [id number]  PCP:  Shelvia Dick, MD  Cardiologist:  Ralene Burger, MD    Referring MD: Shelvia Dick, MD   Chief Complaint  Patient presents with   Follow-up    History of Present Illness:    ALICIA ACKERT is a 53 y.o. male past medical history significant for noncompaction was recognized few years ago MRI showed ratio of noncompaction to compaction 2.3-1 by MRI, additional issue was palpitations, he wore a monitor which showed some sinus tachycardia.  That he was recognized to have sleep apnea treatments has been initiated since that time he is doing well.  Overall he is doing fine came for regular follow-up denies any chest pain tightness squeezing pressure burning chest no palpitation dizziness.  He works for [employer] that some physical work have no difficulty doing it  Past Medical History:  Diagnosis Date   Anxiety    Asthma due to seasonal allergies    Basal cell carcinoma (BCC) of scalp    also perinasal   Colon cancer screening    07/2022 iFOB neg.   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   OSA on CPAP    initial sx: palpitations-fatigue--s/p surgery, much improved   Palpitations 02/09/2020   Endo 11/2020-->pheo w/u NEG   Seasonal allergies     Past Surgical History:  Procedure Laterality Date   Cardiac MRI  04/12/2020   noncompaction of vent myocardium   CHOLECYSTECTOMY N/A 12/06/2016   Procedure: LAPAROSCOPIC CHOLECYSTECTOMY WITH INTRAOPERATIVE CHOLANGIOGRAM;  Surgeon: Lillette Reid III, MD;  Location: [facility];  Service: General;  Laterality: N/A;   MASS EXCISION N/A 08/31/2017   Procedure: MINOR EXCISION SEBACEOUS CYST ON BACK X 2;  Surgeon: Caralyn Chandler, MD;  Location: [facility];  Service: General;  Laterality: N/A;   THROAT SURGERY  1982    tonsilectomy, uvulectomy d/t OSA    TRANSTHORACIC ECHOCARDIOGRAM  02/28/2021   EF 50-55%, mild LA dilation, o/w normal.  03/28/22 EF 50-55%, mod LA dilat, o/w normal.   zio patch  08/2020   some pvc's o/w normal    Current Medications: Current Meds  Medication Sig   albuterol  (VENTOLIN  HFA) 108 (90 Base) MCG/ACT inhaler Inhale 2 puffs into the lungs every 6 (six) hours as needed for wheezing or shortness of breath.   aspirin  EC 81 MG tablet Take 1 tablet (81 mg total) by mouth daily. Swallow whole.   clonazePAM  (KLONOPIN ) 0.25 MG disintegrating tablet Take 1 tablet (0.25 mg total) by mouth 2 (two) times daily.   fexofenadine (ALLEGRA) 180 MG tablet Take 180 mg by mouth every other day.   levocetirizine (XYZAL) 5 MG tablet Take 5 mg by mouth every other day.   mometasone (NASONEX) 50 MCG/ACT nasal spray Place 2 sprays into the nose daily as needed (allergies).   sertraline  (ZOLOFT ) 100 MG tablet Take 1 tablet (100 mg total) by mouth daily.     Allergies:   Sudafed [pseudoephedrine]   Social History   Socioeconomic History   Marital status: Married    Spouse name: Not on file   Number of children: 1   Years of education: Not on file   Highest education level: Associate degree: occupational, Scientist, product/process development, or vocational program  Occupational History   Occupation: Artist  Employer: [employer]    Comment: [employer]  Tobacco Use   Smoking status: Never    Passive exposure: Never   Smokeless tobacco: Never  Vaping Use   Vaping status: Never Used  Substance and Sexual Activity   Alcohol use: Yes    Comment: Less than 2 drinks per month    Drug use: No   Sexual activity: Not on file  Other Topics Concern   Not on file  Social History Narrative   Married, Son born ~ 2012.   Moved to [facility] July 2014, from [place]   Some college/trade school.     Occupation: Database administrator for [employer]   No tobacco or drugs.      Social Drivers of Research scientist (physical sciences) Strain: Low Risk  (09/25/2022)   Overall Financial Resource Strain (CARDIA)    Difficulty of Paying Living Expenses: Not hard at all  Food Insecurity: No Food Insecurity (09/25/2022)   Hunger Vital Sign    Worried About Running Out of Food in the Last Year: Never true    Ran Out of Food in the Last Year: Never true  Transportation Needs: No Transportation Needs (09/25/2022)   PRAPARE - Administrator, Civil Service (Medical): No    Lack of Transportation (Non-Medical): No  Physical Activity: Insufficiently Active (09/25/2022)   Exercise Vital Sign    Days of Exercise per Week: 3 days    Minutes of Exercise per Session: 30 min  Stress: No Stress Concern Present (09/25/2022)   [employer] of Occupational Health - Occupational Stress Questionnaire    Feeling of Stress : Only a little  Social Connections: Unknown (09/25/2022)   Social Connection and Isolation Panel [NHANES]    Frequency of Communication with Friends and Family: More than three times a week    Frequency of Social Gatherings with Friends and Family: Patient declined    Attends Religious Services: Patient declined    Database administrator or Organizations: No    Attends Engineer, structural: Not on file    Marital Status: Married     Family History: [name] family history includes Alcohol abuse in his paternal grandfather; Colon cancer in his maternal grandmother and another family member; Diabetes in an other family member; Drug abuse in his father; High Cholesterol in his mother; Hyperlipidemia in his brother; Lung cancer in his father; Non-Hodgkin's lymphoma in his sister. There is no history of CAD, Stroke, Prostate cancer, or Sleep apnea. ROS:   Please see the history of present illness.    All 14 point review of systems negative except as described per history of present illness  EKGs/Labs/Other Studies Reviewed:    EKG Interpretation Date/Time:  Tuesday April 01 2024 14:13:45  EDT Ventricular Rate:  60 PR Interval:  170 QRS Duration:  96 QT Interval:  406 QTC Calculation: 406 R Axis:   -34  Text Interpretation: Normal sinus rhythm Left axis deviation No previous ECGs available Confirmed by Ralene Burger [id number]) on 04/01/2024 2:50:50 PM    Recent Labs: 04/04/2023: ALT 29; BUN 10; Creatinine, Ser 0.77; Hemoglobin 14.4; Platelets 254.0; Potassium 4.0; Sodium 137; TSH 1.05  Recent Lipid Panel    Component Value Date/Time   CHOL 164 04/04/2023 1055   TRIG 107.0 04/04/2023 1055   HDL 38.20 (L) 04/04/2023 1055   CHOLHDL 4 04/04/2023 1055   VLDL 21.4 04/04/2023 1055   LDLCALC 104 (H) 04/04/2023 1055   LDLDIRECT 98.0 03/05/2018 1334  Physical Exam:    VS:  BP 108/60 (BP Location: Left Arm, Patient Position: Sitting)   Pulse 60   Ht 6' (1.829 m)   Wt 185 lb (83.9 kg)   SpO2 99%   BMI 25.09 kg/m     Wt Readings from Last 3 Encounters:  04/01/24 185 lb (83.9 kg)  02/20/24 191 lb 12.8 oz (87 kg)  10/03/23 207 lb (93.9 kg)     GEN:  Well nourished, well developed in no acute distress HEENT: Normal NECK: No JVD; No carotid bruits LYMPHATICS: No lymphadenopathy CARDIAC: RRR, no murmurs, no rubs, no gallops RESPIRATORY:  Clear to auscultation without rales, wheezing or rhonchi  ABDOMEN: Soft, non-tender, non-distended MUSCULOSKELETAL:  No edema; No deformity  SKIN: Warm and dry LOWER EXTREMITIES: no swelling NEUROLOGIC:  Alert and oriented x 3 PSYCHIATRIC:  Normal affect   ASSESSMENT:    1. Palpitations   2. Noncompaction cardiomyopathy (HCC)   3. OSA (obstructive sleep apnea)   4. Hypertriglyceridemia   5. Dyspnea on exertion    PLAN:    In order of problems listed above:  Noncompaction.  Asymptomatic.  Will repeat echocardiogram to recheck. Palpitations denies having any. Obstructive sleep apnea that being managed by internal medicine team. Dyslipidemia.  He is working on weight loss and proper diet.  I will order fasting lipid  profile asking to recheck it when he stabilize his weight.  K PN show me his LDL 104 HDL 38 however this is from summer of last year before he started modify his diet and exercise   Medication Adjustments/Labs and Tests Ordered: Current medicines are reviewed at length with the patient today.  Concerns regarding medicines are outlined above.  Orders Placed This Encounter  Procedures   EKG 12-Lead   Medication changes: No orders of the defined types were placed in this encounter.   Signed, Manfred Seed, MD, [facility] 04/01/2024 3:05 PM     [facility]

## 2024-04-02 ENCOUNTER — Encounter: Payer: Commercial Managed Care - PPO | Admitting: Family Medicine

## 2024-05-12 ENCOUNTER — Ambulatory Visit (HOSPITAL_BASED_OUTPATIENT_CLINIC_OR_DEPARTMENT_OTHER)
Admission: RE | Admit: 2024-05-12 | Discharge: 2024-05-12 | Disposition: A | Discharge status: Home / Self Care | Source: Ambulatory Visit | Attending: Cardiology | Admitting: Cardiology

## 2024-05-12 DIAGNOSIS — R0600 Dyspnea, unspecified: Secondary | ICD-10-CM | POA: Diagnosis not present

## 2024-05-12 DIAGNOSIS — R0609 Other forms of dyspnea: Secondary | ICD-10-CM | POA: Diagnosis present

## 2024-05-12 LAB — ECHOCARDIOGRAM COMPLETE
AR max vel: 2.77 cm2
AV Area VTI: 2.77 cm2
AV Area mean vel: 2.69 cm2
AV Mean grad: 4 mmHg
AV Peak grad: 8.1 mmHg
Ao pk vel: 1.42 m/s
Area-P 1/2: 4.64 cm2
Calc EF: 59.4 %
S' Lateral: 3 cm
Single Plane A2C EF: 60.5 %
Single Plane A4C EF: 60.2 %

## 2024-05-13 ENCOUNTER — Ambulatory Visit: Payer: Self-pay | Admitting: Cardiology

## 2024-05-13 LAB — LIPID PANEL
Chol/HDL Ratio: 3.7 ratio (ref 0.0–5.0)
Cholesterol, Total: 161 mg/dL (ref 100–199)
HDL: 44 mg/dL (ref >39)
LDL Chol Calc (NIH): 99 mg/dL (ref 0–99)
Triglycerides: 99 mg/dL (ref 0–149)
VLDL Cholesterol Cal: 18 mg/dL (ref 5–40)

## 2024-05-14 ENCOUNTER — Encounter: Payer: Self-pay | Admitting: Family Medicine

## 2024-05-14 ENCOUNTER — Telehealth: Payer: Self-pay

## 2024-05-14 NOTE — Telephone Encounter (Signed)
 Echo Results reviewed with pt as per Dr. Vanetta Shawl note.  Pt verbalized understanding and had no additional questions. Routed to PCP

## 2024-05-15 ENCOUNTER — Telehealth: Payer: Self-pay

## 2024-05-15 NOTE — Telephone Encounter (Signed)
 Left message on My Chart with lab results per Dr. Vanetta Shawl note. Routed to PCP.

## 2024-05-27 ENCOUNTER — Encounter (HOSPITAL_COMMUNITY): Payer: Self-pay

## 2024-05-27 NOTE — Progress Notes (Signed)
 PCP - Dr Aleene Hockey Cardiologist - Dr Lamar Fitch  Chest x-ray - n/a EKG - 04/01/24 Stress Test - n/a ECHO - 05/12/24 Cardiac Cath - n/a  ICD Pacemaker/Loop - n/a  Sleep Study -  Yes (07/2020) CPAP - ***  Diabetes - n/a  Blood Thinner Instructions:  n/a  Aspirin  Instructions: Follow your surgeon's instructions on when to stop aspirin  prior to surgery,  If no instructions were given by your surgeon then you will need to call the office for those instructions.  ERAS - clears til 9:20 AM DOS  Anesthesia review: n/a  STOP now taking any Aspirin  (unless otherwise instructed by your surgeon), Aleve, Naproxen, Ibuprofen, Motrin, Advil, Goody's, BC's, all herbal medications, fish oil, and all vitamins.   Coronavirus Screening Do you have any of the following symptoms:  Cough yes/no: No Fever (>100.79F)  yes/no: No Runny nose yes/no: No Sore throat yes/no: No Difficulty breathing/shortness of breath  yes/no: No  Have you traveled in the last 14 days and where? yes/no: No  Patient verbalized understanding of instructions that were given to them at the PAT appointment. Patient was also instructed that they will need to review over the PAT instructions again at home before surgery.

## 2024-05-27 NOTE — Progress Notes (Signed)
 Surgical Instructions   Your procedure is scheduled on Monday, 06/02/24. Report to [facility] Main Entrance A at 10:20 A.M., then check in with the Admitting office. Any questions or running late day of surgery: call [phone]  Questions prior to your surgery date: call [phone], Monday-Friday, 8am-4pm. If you experience any cold or flu symptoms such as cough, fever, chills, shortness of breath, etc. between now and your scheduled surgery, please notify us  at the above number.     Remember:  Do not eat after midnight the night before your surgery-Sunday   You may drink clear liquids until 9:20 AM, the morning of your surgery.   Clear liquids allowed are: Water, Non-Citrus Juices (without pulp), Carbonated Beverages, Clear Tea (no milk, honey, etc.), Black Coffee Only (NO MILK, CREAM OR POWDERED CREAMER of any kind), and Gatorade.    Take these medicines the morning of surgery with A SIP OF WATER: clonazePAM  (KLONOPIN ) sertraline  (ZOLOFT )  fexofenadine (ALLEGRA) or levocetirizine (XYZAL)  May take these medicines IF NEEDED: albuterol  (VENTOLIN  HFA) mometasone (NASONEX)    One week prior to surgery, STOP taking any Aspirin  (unless otherwise instructed by your surgeon) Aleve, Naproxen, Ibuprofen, Motrin, Advil, Goody's, BC's, all herbal medications, fish oil, and non-prescription vitamins.           If you use a CPAP at night, you may bring your mask/headgear for your overnight stay.   You will be asked to remove any contacts, glasses, piercing's, hearing aid's, dentures/partials prior to surgery. Please bring cases for these items if needed.    Patients discharged the day of surgery will not be allowed to drive home, and someone needs to stay with them for 24 hours.  SURGICAL WAITING ROOM VISITATION Patients may have no more than 2 support people in the waiting area - these visitors may rotate.   Pre-op nurse will coordinate an appropriate time for 1 ADULT support  person, who may not rotate, to accompany patient in pre-op.  Children under the age of 33 must have an adult with them who is not the patient and must remain in the main waiting area with an adult.  If the patient needs to stay at the hospital during part of their recovery, the visitor guidelines for inpatient rooms apply.  Please refer to the [facility] website for the visitor guidelines for any additional information.   If you received a COVID test during your pre-op visit  it is requested that you wear a mask when out in public, stay away from anyone that may not be feeling well and notify your surgeon if you develop symptoms. If you have been in contact with anyone that has tested positive in the last 10 days please notify you surgeon.      Pre-operative CHG Bathing Instructions   You can play a key role in reducing the risk of infection after surgery. Your skin needs to be as free of germs as possible. You can reduce the number of germs on your skin by washing with CHG (chlorhexidine  gluconate) soap before surgery. CHG is an antiseptic soap that kills germs and continues to kill germs even after washing.   DO NOT use if you have an allergy to chlorhexidine /CHG or antibacterial soaps. If your skin becomes reddened or irritated, stop using the CHG and notify one of our RNs at [phone].              TAKE A SHOWER THE NIGHT BEFORE SURGERY-Sunday AND THE DAY OF SURGERY-Monday  Please keep in mind the following:  DO NOT shave, including legs and underarms, 48 hours prior to surgery.   You may shave your face before/day of surgery.  Place clean sheets on your bed the night before surgery Use a clean washcloth (not used since being washed) for each shower. DO NOT sleep with pet's night before surgery.  CHG Shower Instructions:  Wash your face and private area with normal soap. If you choose to wash your hair, wash first with your normal shampoo.  After you use shampoo/soap, rinse  your hair and body thoroughly to remove shampoo/soap residue.  Turn the water OFF and apply half the bottle of CHG soap to a CLEAN washcloth.  Apply CHG soap ONLY FROM YOUR NECK DOWN TO YOUR TOES (washing for 3-5 minutes)  DO NOT use CHG soap on face, private areas, open wounds, or sores.  Pay special attention to the area where your surgery is being performed.  If you are having back surgery, having someone wash your back for you may be helpful. Wait 2 minutes after CHG soap is applied, then you may rinse off the CHG soap.  Pat dry with a clean towel  Put on clean pajamas    Additional instructions for the day of surgery: DO NOT APPLY any lotions, deodorants, cologne, or perfumes.   Do not wear jewelry or makeup Do not wear nail polish, gel polish, artificial nails, or any other type of covering on natural nails (fingers and toes) Do not bring valuables to the hospital. [facility] is not responsible for valuables/personal belongings. Put on clean/comfortable clothes.  Please brush your teeth.  Ask your nurse before applying any prescription medications to the skin.

## 2024-05-28 ENCOUNTER — Other Ambulatory Visit: Payer: Self-pay

## 2024-05-28 ENCOUNTER — Encounter (HOSPITAL_COMMUNITY): Payer: Self-pay

## 2024-05-28 ENCOUNTER — Encounter (HOSPITAL_COMMUNITY)
Admission: RE | Admit: 2024-05-28 | Discharge: 2024-05-28 | Disposition: A | Discharge status: Home / Self Care | Source: Ambulatory Visit | Attending: General Surgery | Admitting: General Surgery

## 2024-05-28 VITALS — BP 131/77 | HR 63 | Temp 98.3°F | Resp 18 | Ht 72.0 in | Wt 189.7 lb

## 2024-05-28 DIAGNOSIS — I428 Other cardiomyopathies: Secondary | ICD-10-CM | POA: Insufficient documentation

## 2024-05-28 DIAGNOSIS — Z01818 Encounter for other preprocedural examination: Secondary | ICD-10-CM

## 2024-05-28 DIAGNOSIS — G4733 Obstructive sleep apnea (adult) (pediatric): Secondary | ICD-10-CM | POA: Diagnosis not present

## 2024-05-28 DIAGNOSIS — Z01812 Encounter for preprocedural laboratory examination: Secondary | ICD-10-CM | POA: Diagnosis not present

## 2024-05-28 DIAGNOSIS — R002 Palpitations: Secondary | ICD-10-CM | POA: Insufficient documentation

## 2024-05-28 HISTORY — DX: Pneumonia, unspecified organism: J18.9

## 2024-05-28 LAB — CBC
HCT: 42.1 % (ref 39.0–52.0)
Hemoglobin: 14.5 g/dL (ref 13.0–17.0)
MCH: 32.2 pg (ref 26.0–34.0)
MCHC: 34.4 g/dL (ref 30.0–36.0)
MCV: 93.6 fL (ref 80.0–100.0)
Platelets: 230 K/uL (ref 150–400)
RBC: 4.5 MIL/uL (ref 4.22–5.81)
RDW: 11.7 % (ref 11.5–15.5)
WBC: 6.6 K/uL (ref 4.0–10.5)
nRBC: 0 % (ref 0.0–0.2)

## 2024-05-29 ENCOUNTER — Ambulatory Visit: Payer: Self-pay | Admitting: General Surgery

## 2024-05-29 NOTE — Progress Notes (Signed)
 Anesthesia Chart Review:  53 year old male follows with cardiology for history of noncompaction cardiomyopathy, palpitations, OSA (no longer on CPAP after weight loss).  Last seen by Dr. Krasowski on 04/01/24 and at that time was noted to be doing well from cardiac standpoint, denied any cardiopulmonary symptoms, reportedly works for [employer] which involves some physical work which he has no difficulty doing.  Updated echo done 05/12/2024 showed prominent LV apical trabeculations consistent with noncompaction cardiomyopathy, EF 55 to 60%, normal RV, normal valves.  Other pertinent history includes anxiety, asthma due to seasonal allergies.  Preop labs reviewed, WNL.  EKG 04/01/24: Normal sinus rhythm.  Rate 60. Left axis deviation  TTE 05/12/2024:  1. Prominent LV apical trabeculations, consistent with previously noted  history of noncompaction cardiomyopathy. Left ventricular ejection  fraction, by estimation, is 55 to 60%. The left ventricle has normal  function. The left ventricle has no regional  wall motion abnormalities. Left ventricular diastolic parameters were  normal.   2. Right ventricular systolic function is normal. The right ventricular  size is normal.   3. The mitral valve is grossly normal. No evidence of mitral valve  regurgitation. No evidence of mitral stenosis.   4. The aortic valve is tricuspid. Aortic valve regurgitation is not  visualized. No aortic stenosis is present.   5. The inferior vena cava is normal in size with greater than 50%  respiratory variability, suggesting right atrial pressure of 3 mmHg.   Comparison(s): Echocardiogram done 03/28/22 showed an EF of 50-55%.     Lynwood Geofm RIGGERS [facility] Short Stay Center/Anesthesiology Phone [phone] 05/29/2024 4:06 PM

## 2024-05-29 NOTE — Anesthesia Preprocedure Evaluation (Addendum)
 Anesthesia Evaluation  Patient identified by MRN, date of birth, ID band Patient awake    Reviewed: Allergy & Precautions, NPO status , Patient's Chart, lab work & pertinent test results  Airway Mallampati: I  TM Distance: <3 FB Neck ROM: Full    Dental  (+) Teeth Intact, Dental Advisory Given   Pulmonary asthma , sleep apnea    breath sounds clear to auscultation       Cardiovascular negative cardio ROS  Rhythm:Regular Rate:Normal     Neuro/Psych   Anxiety     negative neurological ROS     GI/Hepatic negative GI ROS, Neg liver ROS,,,  Endo/Other  negative endocrine ROS    Renal/GU negative Renal ROS     Musculoskeletal negative musculoskeletal ROS (+)    Abdominal   Peds  Hematology negative hematology ROS (+)   Anesthesia Other Findings   Reproductive/Obstetrics                              Anesthesia Physical Anesthesia Plan  ASA: 2  Anesthesia Plan: General   Post-op Pain Management: Tylenol  PO (pre-op)* and Toradol  IV (intra-op)*   Induction: Intravenous  PONV Risk Score and Plan: 3 and Ondansetron , Dexamethasone  and Midazolam   Airway Management Planned: Oral ETT  Additional Equipment: None  Intra-op Plan:   Post-operative Plan: Extubation in OR  Informed Consent: I have reviewed the patients History and Physical, chart, labs and discussed the procedure including the risks, benefits and alternatives for the proposed anesthesia with the patient or authorized representative who has indicated his/her understanding and acceptance.     Dental advisory given  Plan Discussed with: CRNA  Anesthesia Plan Comments: (PAT note by Lynwood Hope, PA-C: 53 year old male follows with cardiology for history of noncompaction cardiomyopathy, palpitations, OSA (no longer on CPAP after weight loss).  Last seen by Dr. Krasowski on 04/01/24 and at that time was noted to be doing well from  cardiac standpoint, denied any cardiopulmonary symptoms, reportedly works for [employer] which involves some physical work which he has no difficulty doing.  Updated echo done 05/12/2024 showed prominent LV apical trabeculations consistent with noncompaction cardiomyopathy, EF 55 to 60%, normal RV, normal valves.  Other pertinent history includes anxiety, asthma due to seasonal allergies.  Preop labs reviewed, WNL.  EKG 04/01/24: Normal sinus rhythm.  Rate 60. Left axis deviation  TTE 05/12/2024: 1. Prominent LV apical trabeculations, consistent with previously noted  history of noncompaction cardiomyopathy. Left ventricular ejection  fraction, by estimation, is 55 to 60%. The left ventricle has normal  function. The left ventricle has no regional  wall motion abnormalities. Left ventricular diastolic parameters were  normal.  2. Right ventricular systolic function is normal. The right ventricular  size is normal.  3. The mitral valve is grossly normal. No evidence of mitral valve  regurgitation. No evidence of mitral stenosis.  4. The aortic valve is tricuspid. Aortic valve regurgitation is not  visualized. No aortic stenosis is present.  5. The inferior vena cava is normal in size with greater than 50%  respiratory variability, suggesting right atrial pressure of 3 mmHg.   Comparison(s): Echocardiogram done 03/28/22 showed an EF of 50-55%.    )         Anesthesia Quick Evaluation

## 2024-06-01 ENCOUNTER — Encounter (HOSPITAL_COMMUNITY): Payer: Self-pay | Admitting: General Surgery

## 2024-06-02 ENCOUNTER — Other Ambulatory Visit: Payer: Self-pay

## 2024-06-02 ENCOUNTER — Ambulatory Visit (HOSPITAL_COMMUNITY)
Admission: RE | Admit: 2024-06-02 | Discharge: 2024-06-02 | Disposition: A | Discharge status: Home / Self Care | Attending: General Surgery | Admitting: General Surgery

## 2024-06-02 ENCOUNTER — Encounter (HOSPITAL_COMMUNITY)
Admission: RE | Disposition: A | Discharge status: Home / Self Care | Payer: Self-pay | Source: Home / Self Care | Attending: General Surgery

## 2024-06-02 ENCOUNTER — Ambulatory Visit (HOSPITAL_COMMUNITY): Payer: Self-pay | Admitting: Physician Assistant

## 2024-06-02 ENCOUNTER — Ambulatory Visit (HOSPITAL_BASED_OUTPATIENT_CLINIC_OR_DEPARTMENT_OTHER): Admitting: Certified Registered Nurse Anesthetist

## 2024-06-02 DIAGNOSIS — K4090 Unilateral inguinal hernia, without obstruction or gangrene, not specified as recurrent: Secondary | ICD-10-CM

## 2024-06-02 DIAGNOSIS — J45909 Unspecified asthma, uncomplicated: Secondary | ICD-10-CM | POA: Diagnosis not present

## 2024-06-02 DIAGNOSIS — G4730 Sleep apnea, unspecified: Secondary | ICD-10-CM | POA: Diagnosis not present

## 2024-06-02 DIAGNOSIS — F419 Anxiety disorder, unspecified: Secondary | ICD-10-CM | POA: Insufficient documentation

## 2024-06-02 HISTORY — PX: INGUINAL HERNIA REPAIR: SHX194

## 2024-06-02 HISTORY — PX: INSERTION OF MESH: SHX5868

## 2024-06-02 SURGERY — REPAIR, HERNIA, INGUINAL, LAPAROSCOPIC
Anesthesia: General | Site: Inguinal | Laterality: Left

## 2024-06-02 MED ORDER — KETOROLAC TROMETHAMINE 30 MG/ML IJ SOLN
INTRAMUSCULAR | Status: DC | PRN
  Administered 2024-06-02 (×2): 30 mg via INTRAVENOUS

## 2024-06-02 MED ORDER — FENTANYL CITRATE (PF) 250 MCG/5ML IJ SOLN
INTRAMUSCULAR | Status: AC
  Filled 2024-06-02: qty 5

## 2024-06-02 MED ORDER — ACETAMINOPHEN 500 MG PO TABS
1000.0000 mg | ORAL_TABLET | ORAL | Status: AC
  Administered 2024-06-02 (×2): 1000 mg via ORAL
  Filled 2024-06-02: qty 2

## 2024-06-02 MED ORDER — ROCURONIUM BROMIDE 10 MG/ML (PF) SYRINGE
PREFILLED_SYRINGE | INTRAVENOUS | Status: DC | PRN
  Administered 2024-06-02 (×2): 50 mg via INTRAVENOUS

## 2024-06-02 MED ORDER — ORAL CARE MOUTH RINSE: 15.0000 mL | Freq: Once | OROMUCOSAL | Status: AC

## 2024-06-02 MED ORDER — CEFAZOLIN SODIUM-DEXTROSE 2-4 GM/100ML-% IV SOLN
2.0000 g | INTRAVENOUS | Status: AC
  Administered 2024-06-02 (×2): 2 g via INTRAVENOUS
  Filled 2024-06-02: qty 100

## 2024-06-02 MED ORDER — ACETAMINOPHEN 10 MG/ML IV SOLN
1000.0000 mg | Freq: Once | INTRAVENOUS | Status: DC | PRN
Start: 2024-06-02 — End: 2024-06-02

## 2024-06-02 MED ORDER — CHLORHEXIDINE GLUCONATE 0.12 % MT SOLN
OROMUCOSAL | Status: AC
  Administered 2024-06-02 (×2): 15 mL via OROMUCOSAL
  Filled 2024-06-02: qty 15

## 2024-06-02 MED ORDER — LIDOCAINE 2% (20 MG/ML) 5 ML SYRINGE
INTRAMUSCULAR | Status: DC | PRN
  Administered 2024-06-02 (×2): 40 mg via INTRAVENOUS

## 2024-06-02 MED ORDER — FENTANYL CITRATE (PF) 250 MCG/5ML IJ SOLN
INTRAMUSCULAR | Status: DC | PRN
  Administered 2024-06-02 (×6): 50 ug via INTRAVENOUS

## 2024-06-02 MED ORDER — BUPIVACAINE HCL (PF) 0.25 % IJ SOLN
INTRAMUSCULAR | Status: AC
  Filled 2024-06-02: qty 30

## 2024-06-02 MED ORDER — MIDAZOLAM HCL 2 MG/2ML IJ SOLN
INTRAMUSCULAR | Status: DC | PRN
  Administered 2024-06-02 (×2): 2 mg via INTRAVENOUS

## 2024-06-02 MED ORDER — 0.9 % SODIUM CHLORIDE (POUR BTL) OPTIME
TOPICAL | Status: DC | PRN
Start: 2024-06-02 — End: 2024-06-02
  Administered 2024-06-02 (×2): 1000 mL

## 2024-06-02 MED ORDER — PROPOFOL 10 MG/ML IV BOLUS
INTRAVENOUS | Status: DC | PRN
  Administered 2024-06-02 (×2): 150 mg via INTRAVENOUS

## 2024-06-02 MED ORDER — CHLORHEXIDINE GLUCONATE CLOTH 2 % EX PADS: 6.0000 | MEDICATED_PAD | Freq: Once | CUTANEOUS | Status: DC

## 2024-06-02 MED ORDER — TRAMADOL HCL 50 MG PO TABS: 50.0000 mg | ORAL_TABLET | Freq: Four times a day (QID) | ORAL | 0 refills | Status: DC | PRN

## 2024-06-02 MED ORDER — CHLORHEXIDINE GLUCONATE 0.12 % MT SOLN: 15.0000 mL | Freq: Once | OROMUCOSAL | Status: AC

## 2024-06-02 MED ORDER — SUGAMMADEX SODIUM 200 MG/2ML IV SOLN
INTRAVENOUS | Status: DC | PRN
  Administered 2024-06-02 (×2): 200 mg via INTRAVENOUS

## 2024-06-02 MED ORDER — ENSURE PRE-SURGERY PO LIQD: 296.0000 mL | Freq: Once | ORAL | Status: DC

## 2024-06-02 MED ORDER — ONDANSETRON HCL 4 MG/2ML IJ SOLN
INTRAMUSCULAR | Status: DC | PRN
  Administered 2024-06-02 (×2): 4 mg via INTRAVENOUS

## 2024-06-02 MED ORDER — DROPERIDOL 2.5 MG/ML IJ SOLN: 0.6250 mg | Freq: Once | INTRAMUSCULAR | Status: DC | PRN

## 2024-06-02 MED ORDER — DEXAMETHASONE SODIUM PHOSPHATE 10 MG/ML IJ SOLN
INTRAMUSCULAR | Status: DC | PRN
  Administered 2024-06-02 (×2): 10 mg via INTRAVENOUS

## 2024-06-02 MED ORDER — BUPIVACAINE HCL 0.25 % IJ SOLN
INTRAMUSCULAR | Status: DC | PRN
  Administered 2024-06-02 (×2): 8 mL

## 2024-06-02 MED ORDER — FENTANYL CITRATE (PF) 100 MCG/2ML IJ SOLN: 25.0000 ug | INTRAMUSCULAR | Status: DC | PRN

## 2024-06-02 MED ORDER — OXYCODONE HCL 5 MG/5ML PO SOLN: 5.0000 mg | Freq: Once | ORAL | Status: DC | PRN

## 2024-06-02 MED ORDER — LACTATED RINGERS IV SOLN: INTRAVENOUS | Status: DC

## 2024-06-02 MED ORDER — OXYCODONE HCL 5 MG PO TABS: 5.0000 mg | ORAL_TABLET | Freq: Once | ORAL | Status: DC | PRN

## 2024-06-02 MED ORDER — MIDAZOLAM HCL 2 MG/2ML IJ SOLN
INTRAMUSCULAR | Status: AC
  Filled 2024-06-02: qty 2

## 2024-06-02 SURGICAL SUPPLY — 28 items
CANISTER SUCTION 3000ML PPV (SUCTIONS) IMPLANT
COVER SURGICAL LIGHT HANDLE (MISCELLANEOUS) ×2 IMPLANT
DERMABOND ADVANCED .7 DNX12 (GAUZE/BANDAGES/DRESSINGS) ×2 IMPLANT
ELECTRODE REM PT RTRN 9FT ADLT (ELECTROSURGICAL) ×2 IMPLANT
ENDOLOOP SUT PDS II 0 18 (SUTURE) IMPLANT
GLOVE BIO SURGEON STRL SZ7.5 (GLOVE) ×4 IMPLANT
GOWN STRL REUS W/ TWL LRG LVL3 (GOWN DISPOSABLE) ×4 IMPLANT
GOWN STRL REUS W/ TWL XL LVL3 (GOWN DISPOSABLE) ×2 IMPLANT
KIT BASIN OR (CUSTOM PROCEDURE TRAY) ×2 IMPLANT
KIT TURNOVER KIT B (KITS) ×2 IMPLANT
MESH 3DMAX 5X7 LT XLRG (Mesh General) IMPLANT
NDL INSUFFLATION 14GA 120MM (NEEDLE) IMPLANT
NEEDLE INSUFFLATION 14GA 120MM (NEEDLE) IMPLANT
NS IRRIG 1000ML POUR BTL (IV SOLUTION) ×2 IMPLANT
PAD ARMBOARD POSITIONER FOAM (MISCELLANEOUS) ×4 IMPLANT
RELOAD STAPLE 4.0 BLU F/HERNIA (INSTRUMENTS) IMPLANT
SCISSORS LAP 5X35 DISP (ENDOMECHANICALS) ×2 IMPLANT
SET TUBE SMOKE EVAC HIGH FLOW (TUBING) ×2 IMPLANT
STAPLER HERNIA 12 8.5 360D (INSTRUMENTS) IMPLANT
SUT MNCRL AB 4-0 PS2 18 (SUTURE) ×2 IMPLANT
SUT VICRYL 0 UR6 27IN ABS (SUTURE) IMPLANT
TOWEL GREEN STERILE FF (TOWEL DISPOSABLE) ×2 IMPLANT
TRAY LAPAROSCOPIC MC (CUSTOM PROCEDURE TRAY) ×2 IMPLANT
TROCAR OPTICAL SHORT 5MM (TROCAR) ×2 IMPLANT
TROCAR OPTICAL SLV SHORT 5MM (TROCAR) ×2 IMPLANT
TROCAR Z THREAD OPTICAL 12X100 (TROCAR) ×2 IMPLANT
WARMER LAPAROSCOPE (MISCELLANEOUS) ×2 IMPLANT
WATER STERILE IRR 1000ML POUR (IV SOLUTION) ×2 IMPLANT

## 2024-06-02 NOTE — Anesthesia Procedure Notes (Signed)
 Procedure Name: Intubation Date/Time: 06/02/2024 9:12 AM  Performed by: Claudene Florina Boga, CRNAPre-anesthesia Checklist: Patient identified, Emergency Drugs available, Suction available and Patient being monitored Patient Re-evaluated:Patient Re-evaluated prior to induction Oxygen Delivery Method: Circle System Utilized Preoxygenation: Pre-oxygenation with 100% oxygen Induction Type: IV induction Ventilation: Mask ventilation without difficulty Laryngoscope Size: Mac and 4 Grade View: Grade I Tube type: Oral Tube size: 7.5 mm Number of attempts: 1 Airway Equipment and Method: Stylet Placement Confirmation: ETT inserted through vocal cords under direct vision, positive ETCO2 and breath sounds checked- equal and bilateral Secured at: 22 cm Tube secured with: Tape Dental Injury: Teeth and Oropharynx as per pre-operative assessment

## 2024-06-02 NOTE — Transfer of Care (Signed)
 Immediate Anesthesia Transfer of Care Note  Patient: David Lowery  Procedure(s) Performed: REPAIR, HERNIA, INGUINAL, LAPAROSCOPIC (Left) INSERTION OF MESH (Left: Inguinal)  Patient Location: PACU  Anesthesia Type:General  Level of Consciousness: awake, alert , and oriented  Airway & Oxygen Therapy: Patient Spontanous Breathing  Post-op Assessment: Report given to RN and Post -op Vital signs reviewed and stable  Post vital signs: Reviewed and stable  Last Vitals:  Vitals Value Taken Time  BP 149/90 06/02/24 10:01  Temp    Pulse 88 06/02/24 10:03  Resp 20 06/02/24 10:03  SpO2 99 % 06/02/24 10:03  Vitals shown include unfiled device data.  Last Pain:  Vitals:   06/02/24 0846  TempSrc: Oral  PainSc:          Complications: No notable events documented.

## 2024-06-02 NOTE — H&P (Signed)
 Chief Complaint: New Patient (Unilateral ing hernia )       History of Present Illness: David Lowery is a 53 y.o. male who is seen today as an office consultation at the request of Dr. McGowen for evaluation of New Patient (Unilateral ing hernia ) .   History of Present Illness David Lowery is a 53 year old male who presents with a left-sided hernia that has increased in size and causes pain when coughing or sneezing.   The left-sided hernia was initially identified approximately ten years ago. It has progressively increased in size and now causes pain, particularly when coughing or sneezing. The sensation is described as an 'ache' and has become more bothersome.   His past surgical history includes a laparoscopic cholecystectomy performed in 2018 or 2019. He denies any other abdominal surgeries. 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       Review of Systems: A complete review of systems was obtained from the patient.  I have reviewed this information and discussed as appropriate with the patient.  See HPI as well for other ROS.   Review of Systems  Constitutional:  Negative for fever.  HENT:  Negative for congestion.   Eyes:  Negative for blurred vision.  Respiratory:  Negative for cough, shortness of breath and wheezing.   Cardiovascular:  Negative for chest pain and palpitations.  Gastrointestinal:  Negative for heartburn.  Genitourinary:  Negative for dysuria.  Musculoskeletal:  Negative for myalgias.  Skin:  Negative for rash.  Neurological:  Negative for dizziness and headaches.  Psychiatric/Behavioral:  Negative for depression and suicidal ideas.   All other systems reviewed and are negative.       Medical History:     Past Medical History:  Diagnosis Date   Anxiety     Asthma, unspecified asthma severity, unspecified whether complicated, unspecified whether persistent  (HHS-HCC)     Sleep apnea        There is no problem list on file for this patient.          Past Surgical History:  Procedure Laterality Date   CHOLECYSTECTOMY          No Known Allergies         Current Outpatient Medications on File Prior to Visit  Medication Sig Dispense Refill   fexofenadine (ALLEGRA) 180 MG tablet Take 180 mg by mouth once daily       sertraline  (ZOLOFT ) 100 MG tablet Take 100 mg by mouth once daily        No current facility-administered medications on file prior to visit.      History reviewed. No pertinent family history.    Social History       Tobacco Use  Smoking Status Never  Smokeless Tobacco Never      Social History        Socioeconomic History   Marital status: Married  Tobacco Use   Smoking status: Never   Smokeless tobacco: Never  Substance and Sexual Activity   Alcohol use: Never   Drug use: Never    Social Drivers of Acupuncturist Strain: Low Risk  (09/25/2022)    Received from [facility]    Overall Financial Resource Strain (CARDIA)     Difficulty of Paying Living Expenses: Not hard at all  Food Insecurity: No Food Insecurity (09/25/2022)    Received  from [facility]    Hunger Vital Sign     Worried About Running Out of Food in the Last Year: Never true     Ran Out of Food in the Last Year: Never true  Transportation Needs: No Transportation Needs (09/25/2022)    Received from [facility] - Transportation     Lack of Transportation (Medical): No     Lack of Transportation (Non-Medical): No  Physical Activity: Insufficiently Active (09/25/2022)    Received from [facility]    Exercise Vital Sign     Days of Exercise per Week: 3 days     Minutes of Exercise per Session: 30 min  Stress: No Stress Concern Present (09/25/2022)    Received from [facility] - Occupational Stress Questionnaire     Feeling of Stress : Only a little  Social  Connections: Unknown (09/25/2022)    Received from [facility]    Social Connection and Isolation Panel [NHANES]     Frequency of Communication with Friends and Family: More than three times a week     Frequency of Social Gatherings with Friends and Family: Patient declined     Attends Religious Services: Patient declined     Active Member of Clubs or Organizations: No     Marital Status: Married  Housing Stability: Unknown (03/11/2024)    Housing Stability Vital Sign     Homeless in the Last Year: No      Objective:       Body mass index is 25.58 kg/m. Physical Exam Constitutional:      Appearance: Normal appearance.  HENT:     Head: Normocephalic and atraumatic.     Nose: Nose normal. No congestion.     Mouth/Throat:     Mouth: Mucous membranes are moist.     Pharynx: Oropharynx is clear.  Eyes:     Pupils: Pupils are equal, round, and reactive to light.  Cardiovascular:     Rate and Rhythm: Normal rate and regular rhythm.     Pulses: Normal pulses.     Heart sounds: Normal heart sounds. No murmur heard.    No friction rub. No gallop.  Pulmonary:     Effort: Pulmonary effort is normal. No respiratory distress.     Breath sounds: Normal breath sounds. No stridor. No wheezing, rhonchi or rales.  Abdominal:     General: Abdomen is flat.     Hernia: A hernia is present. Hernia is present in the left inguinal area. There is no hernia in the right inguinal area.  Musculoskeletal:        General: Normal range of motion.     Cervical back: Normal range of motion.  Skin:    General: Skin is warm and dry.  Neurological:     General: No focal deficit present.     Mental Status: He is alert and oriented to person, place, and time.  Psychiatric:        Mood and Affect: Mood normal.        Thought Content: Thought content normal.          Assessment and Plan:  Diagnoses and all orders for this visit:   Unilateral inguinal hernia without obstruction or gangrene, recurrence  not specified     David Lowery is a 53 y.o. male     We will proceed to the OR for a lap left  inguinal hernia repair with  mesh. All risks and benefits were discussed with the patient, to generally include infection, bleeding, damage to surrounding structures, acute and chronic nerve pain, and recurrence. Alternatives were offered and described.  All questions were answered and the patient voiced understanding of the procedure and wishes to proceed at this point.             No follow-ups on file.   Lynda Leos, MD, [facility] Surgery, [place] [facility] & Minimally Invasive Surgery

## 2024-06-02 NOTE — Discharge Instructions (Signed)

## 2024-06-02 NOTE — Anesthesia Postprocedure Evaluation (Signed)
 Anesthesia Post Note  Patient: QUINTUS PREMO  Procedure(s) Performed: REPAIR, HERNIA, INGUINAL, LAPAROSCOPIC (Left) INSERTION OF MESH (Left: Inguinal)     Patient location during evaluation: PACU Anesthesia Type: General Level of consciousness: awake and alert Pain management: pain level controlled Vital Signs Assessment: post-procedure vital signs reviewed and stable Respiratory status: spontaneous breathing, nonlabored ventilation, respiratory function stable and patient connected to nasal cannula oxygen Cardiovascular status: blood pressure returned to baseline and stable Postop Assessment: no apparent nausea or vomiting Anesthetic complications: no   No notable events documented.  Last Vitals:  Vitals:   06/02/24 1030 06/02/24 1032  BP: 125/72 125/72  Pulse: 69 67  Resp: 18 15  Temp:  36.7 C  SpO2: 92% 94%    Last Pain:  Vitals:   06/02/24 1032  TempSrc:   PainSc: 1                  Franky JONETTA Bald

## 2024-06-02 NOTE — Op Note (Signed)
 06/02/2024  9:43 AM  PATIENT:  David Lowery  53 y.o. male  PRE-OPERATIVE DIAGNOSIS:  LEFT INGUINAL HERNIA  POST-OPERATIVE DIAGNOSIS:  LEFT INDIRECT INGUINAL HERNIA  PROCEDURE:  Procedure(s): REPAIR, HERNIA, INGUINAL, LAPAROSCOPIC (Left) INSERTION OF MESH (Left)  SURGEON:  Surgeons and Role:    DEWAINE Rubin Calamity, MD - Primary  ASSISTANTS: Dr. Eva Barrier, PGY-4   ANESTHESIA:   local and general  EBL:  minimal   BLOOD ADMINISTERED:none  DRAINS: none   LOCAL MEDICATIONS USED:  BUPIVICAINE   SPECIMEN:  No Specimen  DISPOSITION OF SPECIMEN:  N/A  COUNTS:  YES  TOURNIQUET:  * No tourniquets in log *  DICTATION: .Dragon Dictation Counts: reported as correct x 2   Findings:  The patient had a large sized left indirect hernia    Indications for procedure:  The patient is a 53 year old male with a left inguinal hernia for several months. Patient complained of symptomatology to the left inguinal area. The patient was taken back for elective inguinal hernia repair.   Details of the procedure: The patient was taken back to the operating room. The patient was placed in supine position with bilateral SCDs in place.  The patient was prepped and draped in the usual sterile fashion.  After appropriate anitbiotics were confirmed, a time-out was confirmed and all facts were verified.   0.25% Marcaine  was used to infiltrate the umbilical area. A 11-blade was used to cut down the skin and blunt dissection was used to get the anterior fashion.  The anterior fascia was incised approximately 1 cm and the muscles were retracted laterally. Blunt dissection was then used to create a space in the preperitoneal area. At this time a 10 mm camera was then introduced into the space and advanced the pubic tubercle and a 12 mm trocar was placed over this and insufflation was started.  At this time and space was created from medial to laterally the preperitoneal space.  Cooper's ligament was  initially cleaned off.  The hernia sac was identified in the indirect space. Dissection of the hernia sac and cord structures was undertaken the vas deferens was identified and protected in all parts of the case.    Once the hernia sac was taken down to approximately the umbilicus a Left Bard 3D Max mesh, size: Nickola, was  introduced into the preperitoneal space.  The mesh was brought over to cover the direct and indirect hernia spaces.  This was anchored into place and secured to Cooper's ligament with 4.29mm staples from a Coviden hernia stapler. It was anchored to the anterior abdominal wall with 4.8 mm staples. The hernia sac was seen lying posterior to the mesh. There was no staples placed laterally. The insufflation was evacuated and the peritoneum was seen posterior to the mesh. The trochars were removed. The anterior fascia was reapproximated using #1 Vicryl on a UR- 6.  Intra-abdominal air was evacuated and the Veress needle removed. The skin was reapproximated using 4-0 Monocryl subcuticular fashion and Dermabond. The patient was awakened from general anesthesia and taken to recovery in stable condition.    PLAN OF CARE: Discharge to home after PACU  PATIENT DISPOSITION:  PACU - hemodynamically stable.   Delay start of Pharmacological VTE agent (>24hrs) due to surgical blood loss or risk of bleeding: not applicable

## 2024-06-03 ENCOUNTER — Encounter (HOSPITAL_COMMUNITY): Payer: Self-pay | Admitting: General Surgery

## 2024-06-19 ENCOUNTER — Encounter: Admitting: Family Medicine

## 2024-06-30 ENCOUNTER — Ambulatory Visit (INDEPENDENT_AMBULATORY_CARE_PROVIDER_SITE_OTHER): Admitting: Family Medicine

## 2024-06-30 ENCOUNTER — Encounter: Payer: Self-pay | Admitting: Family Medicine

## 2024-06-30 ENCOUNTER — Other Ambulatory Visit

## 2024-06-30 VITALS — BP 118/71 | HR 63 | Temp 98.8°F | Ht 73.0 in | Wt 190.2 lb

## 2024-06-30 DIAGNOSIS — Z0000 Encounter for general adult medical examination without abnormal findings: Secondary | ICD-10-CM | POA: Diagnosis not present

## 2024-06-30 DIAGNOSIS — Z79899 Other long term (current) drug therapy: Secondary | ICD-10-CM

## 2024-06-30 DIAGNOSIS — Z125 Encounter for screening for malignant neoplasm of prostate: Secondary | ICD-10-CM | POA: Diagnosis not present

## 2024-06-30 DIAGNOSIS — F99 Mental disorder, not otherwise specified: Secondary | ICD-10-CM | POA: Diagnosis not present

## 2024-06-30 DIAGNOSIS — Z1211 Encounter for screening for malignant neoplasm of colon: Secondary | ICD-10-CM

## 2024-06-30 DIAGNOSIS — F5105 Insomnia due to other mental disorder: Secondary | ICD-10-CM

## 2024-06-30 DIAGNOSIS — F411 Generalized anxiety disorder: Secondary | ICD-10-CM

## 2024-06-30 MED ORDER — CLONAZEPAM 0.25 MG PO TBDP: 0.2500 mg | ORAL_TABLET | Freq: Two times a day (BID) | ORAL | 2 refills | Status: AC

## 2024-06-30 MED ORDER — ALBUTEROL SULFATE HFA 108 (90 BASE) MCG/ACT IN AERS: 2.0000 | INHALATION_SPRAY | Freq: Four times a day (QID) | RESPIRATORY_TRACT | 1 refills | Status: AC | PRN

## 2024-06-30 NOTE — Progress Notes (Signed)
 Office Note 06/30/2024  CC:  Chief Complaint  Patient presents with   Annual Exam    Pt is fasting    HPI:  Patient is a 53 y.o. male who is here for annual health maintenance exam and f/u anxiety w/insomnia.  David Lowery is feeling very well. His inguinal hernia surgery went well.  Mood is stable, anxiety level stable. 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   PMP AWARE reviewed today: most recent rx for clonazepam  was filled 04/02/2023, # 60, rx by me. No red flags.  Past Medical History:  Diagnosis Date   Allergy 1990   seasonal hay fever   Anxiety    Asthma due to seasonal allergies    rarely uses inhaler per patient   Basal cell carcinoma (BCC) of scalp    also perinasal   Colon cancer screening    07/2022 iFOB neg.   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), no problems per patient   OSA on CPAP    initial sx: palpitations-fatigue--s/p surgery, much improved; patient does not used CPAP, lost weight per patient on 05/28/24   Palpitations 02/09/2020   Endo 11/2020-->pheo w/u NEG; no problems per patient on 05/28/24   Pneumonia    x 1 as a child   Seasonal allergies    rarely uses inhaler   Sleep apnea 2009   UP3 surgery in 2010   Trouble in sleeping 04/02/2023   no current problems per patient on 05/28/24    Past Surgical History:  Procedure Laterality Date   Cardiac MRI  04/12/2020   noncompaction of vent myocardium   CHOLECYSTECTOMY N/A 12/06/2016   Procedure: LAPAROSCOPIC CHOLECYSTECTOMY WITH INTRAOPERATIVE CHOLANGIOGRAM;  Surgeon: Deward Null III, MD;  Location: [facility] OR;  Service: General;  Laterality: N/A;   INGUINAL HERNIA REPAIR Left 06/02/2024   Procedure: REPAIR, HERNIA, INGUINAL, LAPAROSCOPIC;  Surgeon: Rubin Calamity, MD;  Location:  [facility] OR;  Service: General;  Laterality: Left;   INSERTION OF MESH Left 06/02/2024   Procedure: INSERTION OF MESH;  Surgeon: Rubin Calamity, MD;  Location: [facility] OR;  Service: General;  Laterality: Left;   MASS EXCISION N/A 08/31/2017   Procedure: MINOR EXCISION SEBACEOUS CYST ON BACK X 2;  Surgeon: Null Deward MOULD, MD;  Location: [facility];  Service: General;  Laterality: N/A;   THROAT SURGERY  1982   tonsilectomy, uvulectomy d/t OSA    TRANSTHORACIC ECHOCARDIOGRAM  02/28/2021   EF 50-55%, mild LA dilation, o/w normal.  03/28/22 EF 50-55%, mod LA dilat, o/w normal.  04/2024 1. Prominent LV apical trabeculations, consistent with previously noted history of noncompaction cardiomyopathy, otherwise all nl.   zio patch  08/2020   some pvc's o/w normal    Family History  Problem Relation Age of Onset   High Cholesterol Mother    Anxiety disorder Mother    Lung cancer Father    Drug abuse Father    Anxiety disorder Father    Non-Hodgkin's lymphoma Sister    Hyperlipidemia Brother        bro, mother, GM   Anxiety disorder Brother    Colon cancer Maternal Grandmother    Alcohol abuse Paternal Grandfather    Colon cancer Other        GM at age 92  Diabetes Other        aunt   Anxiety disorder Sister    CAD Neg Hx    Stroke Neg Hx    Prostate cancer Neg Hx    Sleep apnea Neg Hx     Social History   Socioeconomic History   Marital status: Married    Spouse name: Not on file   Number of children: 1   Years of education: Not on file   Highest education level: Associate degree: occupational, Scientist, product/process development, or vocational program  Occupational History   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   Moved to [facility] July 2014, from [place]   Some college/trade school.     Occupation: Database administrator for [employer]   No tobacco or drugs.      Social Drivers of Corporate investment banker Strain: Low Risk  (09/25/2022)   Overall Financial Resource Strain (CARDIA)    Difficulty of Paying Living Expenses: Not hard at all  Food Insecurity: No Food Insecurity (09/25/2022)   Hunger Vital Sign    Worried About Running Out of Food in the Last Year: Never true    Ran Out of Food in the Last Year: Never true  Transportation Needs: No Transportation Needs (09/25/2022)   PRAPARE - Administrator, Civil Service (Medical): No    Lack of Transportation (Non-Medical): No  Physical Activity: Insufficiently Active (09/25/2022)   Exercise Vital Sign    Days of Exercise per Week: 3 days    Minutes of Exercise per Session: 30 min  Stress: No Stress Concern Present (09/25/2022)   [employer] of Occupational Health - Occupational Stress Questionnaire    Feeling of Stress : Only a little  Social Connections: Unknown (09/25/2022)   Social Connection and Isolation Panel    Frequency of Communication with Friends and Family: More than three times a week    Frequency of Social Gatherings with Friends and Family: Patient declined    Attends Religious Services: Patient declined    Database administrator or Organizations: No    Attends Engineer, structural: Not on file    Marital Status: Married  Catering manager Violence: Not on file    Outpatient Medications Prior to Visit  Medication Sig Dispense Refill   aspirin  EC 81 MG tablet Take 1 tablet (81 mg total) by mouth daily. Swallow whole. 90 tablet 3   fexofenadine (ALLEGRA) 180 MG tablet Take 180 mg by mouth every other day.     levocetirizine (XYZAL) 5 MG tablet Take 5 mg by mouth every other day.     mometasone  (NASONEX) 50 MCG/ACT nasal spray Place 2 sprays into the nose daily as needed (allergies).     sertraline  (ZOLOFT ) 100 MG tablet Take 1 tablet (100 mg total) by mouth daily. 90 tablet 3   albuterol  (VENTOLIN  HFA) 108 (90 Base) MCG/ACT inhaler Inhale 2 puffs into the lungs every 6 (six) hours as needed for wheezing or  shortness of breath. 1 Inhaler 1   clonazePAM  (KLONOPIN ) 0.25 MG disintegrating tablet Take 1 tablet (0.25 mg total) by mouth 2 (two) times daily. (Patient taking differently: Take 0.25 mg by mouth as needed.) 60 tablet 1   traMADol  (ULTRAM ) 50 MG tablet Take 1 tablet (50 mg total) by mouth every 6 (six) hours as needed. (Patient not taking: Reported on 06/30/2024) 20 tablet 0   No facility-administered medications prior to visit.    Allergies  Allergen Reactions   Sudafed [Pseudoephedrine] Anxiety and Palpitations    Review of Systems  Constitutional:  Negative for appetite change, chills, fatigue and fever.  HENT:  Negative for congestion, dental problem, ear pain and sore throat.   Eyes:  Negative for discharge, redness and visual disturbance.  Respiratory:  Negative for cough, chest tightness, shortness of breath and wheezing.   Cardiovascular:  Negative for chest pain, palpitations and leg swelling.  Gastrointestinal:  Negative for abdominal pain, blood in stool, diarrhea, nausea and vomiting.  Genitourinary:  Negative for difficulty urinating, dysuria, flank pain, frequency, hematuria and urgency.  Musculoskeletal:  Negative for arthralgias, back pain, joint swelling, myalgias and neck stiffness.  Skin:  Negative for pallor and rash.  Neurological:  Negative for dizziness, speech difficulty, weakness and headaches.  Hematological:  Negative for adenopathy. Does not bruise/bleed easily.  Psychiatric/Behavioral:  Negative for confusion and sleep disturbance. The patient is not nervous/anxious.     PE;    06/30/2024    1:13 PM 06/02/2024   10:32 AM 06/02/2024   10:30 AM   Vitals with BMI  Height 6' 1    Weight 190 lbs 3 oz    BMI 25.1    Systolic 118 125 874  Diastolic 71 72 72  Pulse 63 67 69   Gen: Alert, well appearing.  Patient is oriented to person, place, time, and situation. AFFECT: pleasant, lucid thought and speech. ENT: Ears: EACs clear, normal epithelium.  TMs with good light reflex and landmarks bilaterally.  Eyes: no injection, icteris, swelling, or exudate.  EOMI, PERRLA. Nose: no drainage or turbinate edema/swelling.  No injection or focal lesion.  Mouth: lips without lesion/swelling.  Oral mucosa pink and moist.  Dentition intact and without obvious caries or gingival swelling.  Oropharynx without erythema, exudate, or swelling.  Neck: supple/nontender.  No LAD, mass, or TM.  Carotid pulses 2+ bilaterally, without bruits. CV: RRR, no m/r/g.   LUNGS: CTA bilat, nonlabored resps, good aeration in all lung fields. ABD: soft, NT, ND, BS normal.  No hepatospenomegaly or mass.  No bruits. EXT: no clubbing, cyanosis, or edema.  Musculoskeletal: no joint swelling, erythema, warmth, or tenderness.  ROM of all joints intact. Skin - no sores or suspicious lesions or rashes or color changes  Pertinent labs:  Lab Results  Component Value Date   TSH 1.05 04/04/2023   Lab Results  Component Value Date   WBC 6.6 05/28/2024   HGB 14.5 05/28/2024   HCT 42.1 05/28/2024   MCV 93.6 05/28/2024   PLT 230 05/28/2024   Lab Results  Component Value Date   CREATININE 0.77 04/04/2023   BUN 10 04/04/2023   NA 137 04/04/2023   K 4.0 04/04/2023   CL 102 04/04/2023   CO2 26 04/04/2023   Lab Results  Component Value Date   ALT 29 04/04/2023   AST 23 04/04/2023   ALKPHOS 62 04/04/2023   BILITOT 1.3 (H) 04/04/2023   Lab Results  Component Value Date   CHOL 161  05/12/2024   Lab Results  Component Value Date   HDL 44 05/12/2024   Lab Results  Component Value Date   LDLCALC 99 05/12/2024   Lab Results  Component Value Date   TRIG 99  05/12/2024   Lab Results  Component Value Date   CHOLHDL 3.7 05/12/2024   Lab Results  Component Value Date   PSA 0.45 04/04/2023   Lab Results  Component Value Date   HGBA1C 5.5 03/29/2021   ASSESSMENT AND PLAN:   #1 health maintenance exam: Reviewed age and gender appropriate health maintenance issues (prudent diet, regular exercise, health risks of tobacco and excessive alcohol, use of seatbelts, fire alarms in home, use of sunscreen).  Also reviewed age and gender appropriate health screening as well as vaccine recommendations. Vaccines: Prevnar-->declined.  Shingrix-->declined Labs: CMET, PSA, TSH (CBC normal last month, lipid panel was good at cardiologist 2 months ago) Prostate ca screening:  PSA today Colon ca screening: iFOB neg 07/2022.  Cologuard ordered today.  2.  GAD with anxiety-related insomnia. Doing well long-term on sertraline  100 mg a day and clonazepam  0.25 mg twice daily as needed. #60, refill x 2.  An After Visit Summary was printed and given to the patient.  FOLLOW UP:  Return in about 6 months (around 12/28/2024) for routine chronic illness f/u.  Signed:  Gerlene Hockey, MD           06/30/2024

## 2024-07-01 ENCOUNTER — Ambulatory Visit: Payer: Self-pay | Admitting: Family Medicine

## 2024-07-01 LAB — COMPREHENSIVE METABOLIC PANEL WITH GFR
AG Ratio: 2.1 (calc) (ref 1.0–2.5)
ALT: 19 U/L (ref 9–46)
AST: 18 U/L (ref 10–35)
Albumin: 4.6 g/dL (ref 3.6–5.1)
Alkaline phosphatase (APISO): 57 U/L (ref 35–144)
BUN: 15 mg/dL (ref 7–25)
CO2: 29 mmol/L (ref 20–32)
Calcium: 9.5 mg/dL (ref 8.6–10.3)
Chloride: 102 mmol/L (ref 98–110)
Creat: 0.7 mg/dL (ref 0.70–1.30)
Globulin: 2.2 g/dL (ref 1.9–3.7)
Glucose, Bld: 107 mg/dL — ABNORMAL HIGH (ref 65–99)
Potassium: 4 mmol/L (ref 3.5–5.3)
Sodium: 140 mmol/L (ref 135–146)
Total Bilirubin: 1 mg/dL (ref 0.2–1.2)
Total Protein: 6.8 g/dL (ref 6.1–8.1)
eGFR: 112 mL/min/1.73m2 (ref >=60)

## 2024-07-01 LAB — TSH: TSH: 0.73 m[IU]/L (ref 0.40–4.50)

## 2024-07-01 LAB — PSA: PSA: 0.47 ng/mL (ref <=4.00)

## 2024-07-22 LAB — COLOGUARD: COLOGUARD: NEGATIVE

## 2024-07-23 ENCOUNTER — Encounter: Payer: Self-pay | Admitting: Family Medicine

## 2024-07-23 ENCOUNTER — Ambulatory Visit: Payer: Self-pay | Admitting: Family Medicine

## 2024-11-16 ENCOUNTER — Other Ambulatory Visit: Payer: Self-pay | Admitting: Family Medicine

## 2024-12-29 ENCOUNTER — Ambulatory Visit: Admitting: Family Medicine
